# Patient Record
Sex: Female | Born: 1985 | Race: Black or African American | Hispanic: No | Marital: Married | Smoking: Never smoker
Health system: Southern US, Community
[De-identification: ages and names within clinical notes are randomized; demographics above are authoritative.]

## PROBLEM LIST (undated history)

## (undated) ENCOUNTER — Inpatient Hospital Stay (HOSPITAL_COMMUNITY): Payer: Self-pay

## (undated) ENCOUNTER — Inpatient Hospital Stay (HOSPITAL_COMMUNITY)

## (undated) DIAGNOSIS — D649 Anemia, unspecified: Secondary | ICD-10-CM

## (undated) DIAGNOSIS — O26899 Other specified pregnancy related conditions, unspecified trimester: Secondary | ICD-10-CM

## (undated) DIAGNOSIS — D573 Sickle-cell trait: Secondary | ICD-10-CM

## (undated) DIAGNOSIS — R87629 Unspecified abnormal cytological findings in specimens from vagina: Secondary | ICD-10-CM

## (undated) DIAGNOSIS — R197 Diarrhea, unspecified: Secondary | ICD-10-CM

## (undated) DIAGNOSIS — Z3480 Encounter for supervision of other normal pregnancy, unspecified trimester: Secondary | ICD-10-CM

## (undated) DIAGNOSIS — O219 Vomiting of pregnancy, unspecified: Secondary | ICD-10-CM

## (undated) HISTORY — PX: VULVA SURGERY: SHX837

## (undated) HISTORY — PX: TIBIA FRACTURE SURGERY: SHX806

## (undated) HISTORY — DX: Encounter for supervision of other normal pregnancy, unspecified trimester: Z34.80

## (undated) HISTORY — DX: Diarrhea, unspecified: R19.7

## (undated) HISTORY — DX: Anemia, unspecified: D64.9

## (undated) HISTORY — DX: Unspecified abnormal cytological findings in specimens from vagina: R87.629

## (undated) HISTORY — DX: Other specified pregnancy related conditions, unspecified trimester: O26.899

## (undated) HISTORY — PX: FIBULA FRACTURE SURGERY: SHX947

## (undated) HISTORY — PX: WISDOM TOOTH EXTRACTION: SHX21

## (undated) HISTORY — DX: Vomiting of pregnancy, unspecified: O21.9

---
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
## 2009-04-13 ENCOUNTER — Other Ambulatory Visit: Admission: RE | Admit: 2009-04-13 | Discharge: 2009-04-13 | Payer: Self-pay | Admitting: Family Medicine

## 2010-10-26 ENCOUNTER — Other Ambulatory Visit
Admission: RE | Admit: 2010-10-26 | Discharge: 2010-10-26 | Payer: Self-pay | Source: Home / Self Care | Admitting: Family Medicine

## 2011-03-24 NOTE — H&P (Signed)
[facility]  Patient:    Christy [name], Christy [name] Visit Number: [id number] MRN: [id number]          Service Type: EMS Location: [place] Attending Physician:  Osvaldo Human Dictated by:   Janine Limbo, M.D. Admit Date:  08/07/2001                           History and Physical  HISTORY OF PRESENT ILLNESS:     Christy [name] is a 25 year old female gravida 0, who presents to the emergency department at [facility] complaining of pain in her vulvar area.  On August 07, 2001, the patient fell in a "split" fashion and bumped her vulva.  She presented to the emergency room at the [facility] where she was found to have a small hematoma. She was told to put ice on the area and given ibuprofen.  She then presented to the [facility] tonight complaining of a great deal of discomfort and reporting that the area had grown significantly.  The patient had a normal onset of menses one year ago.  She denies other GYN issues or problems.  DRUG ALLERGIES:                 PENICILLIN causes hives.  PAST MEDICAL HISTORY:           The patient had a laceration on  her forehead repaired surgically.  She has popcorn removed from her ear as a child.  She has had no other medical problems.  She is up-to-date with her immunizations.  SOCIAL HISTORY:                 The patient is a Consulting civil engineer at [employer]. She denies cigarette use, alcohol use and recreational drug use.  REVIEW OF SYSTEMS:              Noncontributory.  FAMILY HISTORY:                 Noncontributory.  PHYSICAL EXAMINATION: GENERAL:                        The patient is afebrile and her vital signs are stable.  HEENT:                          Within normal limits except for an upset young woman because of pain.  CHEST:                          Clear.  HEART:                          Regular rate and rhythm.  ABDOMEN:                        Her abdomen is  nontender.  EXTREMITIES:                    Her extremities are within normal limits.  PELVIC:                         External genitalia:  There is marked swelling and tenderness in the right vulvar area.  The area is approximately 10 cm x 6 cm and tensely swollen.  Vaginal examination and examination of the uterus was deferred because of discomfort.  ASSESSMENT:                     Enlarging vulvar hematoma due to trauma.  PLAN:                           A long discussion was held with the patient and her mother about the etiology of her discomfort and possible therapy.  I have recommended that we proceed to the operating room for evacuation of the vulvar hematoma and for ligation of a bleeding vessel if we can find it.  They understand the indications for this procedure, and they accept the associated risks which include, but are not limited to, anesthetic complications, bleeding, infections, and possible damage to the surrounding organs. Dictated by:   Janine Limbo, M.D. Attending Physician:  Osvaldo Human DD:  08/08/01 TD:  08/08/01 Job: [id number]

## 2011-03-24 NOTE — Op Note (Signed)
[facility]  Patient:    Christy [name], Christy [name] Visit Number: [id number] MRN: [id number]          Service Type: GYN Location: [id number] Attending Physician:  Leonard Schwartz Dictated by:   Janine Limbo, M.D. Proc. Date: 08/08/01 Admit Date:  08/08/2001                             Operative Report  PREOPERATIVE DIAGNOSIS:       Expanding hematoma of the right labia.  POSTOPERATIVE DIAGNOSIS:      Expanding hematoma of the right labia.  PROCEDURE:                    Evacuation of hematoma.  SURGEON:                      Janine Limbo, M.D.  ANESTHESIA:                   General.  INDICATIONS:                  The patient is a 25 year old female, gravida 0, who fall on August 07, 2001, hitting her vulva.  She then had a slow expansion of the area that became very tender.  The patient and her mother understand the indications for the surgical procedure that was performed and they accepted the risks of but not limited to anesthetic complications, bleeding, infection and possible damage to surrounding structures.  FINDINGS:                     There was a 12 x 7 cm hematoma of the right labia.  There was a 200 cc blood clot within the area.  No other abnormalities were noted.  DESCRIPTION OF PROCEDURE:     The patient was taken to the operating room, where a general anesthetic was given.  The patients lower abdomen, perineum and vagina were then prepped with multiple layers of Betadine.  The patient was sterilely draped.  An incision was made on the medial aspect of the right labia majora.  A total of 200 cc of blood clot was removed.  The hematoma cavity was then cleaned with Betadine and then irrigated with normal saline. There was a small amount of bleeding from the base of the hematoma. Hemostasis was achieved using figure-of-eight sutures of 2-0 Vicryl.  The space was then closed using interrupted sutures of 2-0 Vicryl.  The  medial aspect of the labia was then closed using a running suture of 4-0 Vicryl.  A small area was left open at the bottom of the incision so that any small amount of bleeding or serous drainage could spontaneously drain.  The area was then injected with 20 cc of 0.25% Marcaine.  A #14 catheter was then placed in the bladder.  The patient was awakened from her anesthetic and taken to the recovery room in stable condition.  Sponge, needle and instrument counts were correct.  Estimated blood loss was 200 cc.  FOLLOW-UP INSTRUCTIONS:       The patient will be observed in the hospital for approximately eight hours.  She will be allowed to go home later.  She will be given doxycycline 100 mg b.i.d. for ten days.  She will be given Tylenol #3, 1-2 p.o. q.4h. p.r.n. pain.  She will also be allowed to use ibuprofen  600 mg q.6h. p.r.n. pain.  She will return to the office in two weeks for a follow-up examination.  She will call for questions or concerns. Dictated by:   Janine Limbo, M.D. Attending Physician:  Leonard Schwartz DD:  08/08/01 TD:  08/08/01 Job: [id number]

## 2015-12-08 DIAGNOSIS — Z0289 Encounter for other administrative examinations: Secondary | ICD-10-CM | POA: Insufficient documentation

## 2017-11-06 NOTE — L&D Delivery Note (Signed)
Delivery Note Progressed to complete dilation and pushed well to SVD  At 6:02 AM a viable and healthy female was delivered via VBAC, Spontaneous (Presentation:ROA ).  APGAR: 9, 9; weight  .   Placenta status:Spontaneous, though slightly delayed.  3 vessel Cord:  with the following complications: There was a velamentous insertion of vessels through membranes into edge of placenta.  Vessels did traverse through the membranes.  Cord noted to be short, about 13-14 inches.  Sent to pathology  Anesthesia:  Epidural Episiotomy: None Lacerations: None Suture Repair: none Est. Blood Loss (mL): 100  Mom to postpartum.  Baby to Couplet care / Skin to Skin.  Wynelle BourgeoisMarie Williams 07/24/2018, 6:40 AM  Please schedule this patient for Postpartum visit in: 4 weeks with the following provider: Any provider For C/S patients schedule nurse incision check in weeks 2 weeks: no Low risk pregnancy complicated by: Velementous cord insertion, SGA Delivery mode:  SVD Anticipated Birth Control:  vasectomy PP Procedures needed: none  Schedule Integrated BH visit: no

## 2018-02-25 ENCOUNTER — Ambulatory Visit (INDEPENDENT_AMBULATORY_CARE_PROVIDER_SITE_OTHER): Payer: TRICARE For Life (TFL) | Admitting: Obstetrics

## 2018-02-25 ENCOUNTER — Encounter: Payer: Self-pay | Admitting: Obstetrics

## 2018-02-25 VITALS — BP 122/78 | HR 79 | Temp 98.8°F | Ht 61.0 in | Wt 188.6 lb

## 2018-02-25 DIAGNOSIS — Z3480 Encounter for supervision of other normal pregnancy, unspecified trimester: Secondary | ICD-10-CM

## 2018-02-25 DIAGNOSIS — Z113 Encounter for screening for infections with a predominantly sexual mode of transmission: Secondary | ICD-10-CM

## 2018-02-25 DIAGNOSIS — Z124 Encounter for screening for malignant neoplasm of cervix: Secondary | ICD-10-CM

## 2018-02-25 DIAGNOSIS — Z3481 Encounter for supervision of other normal pregnancy, first trimester: Secondary | ICD-10-CM

## 2018-02-25 DIAGNOSIS — Z3689 Encounter for other specified antenatal screening: Secondary | ICD-10-CM | POA: Diagnosis not present

## 2018-02-25 DIAGNOSIS — Z3687 Encounter for antenatal screening for uncertain dates: Secondary | ICD-10-CM

## 2018-02-25 DIAGNOSIS — J301 Allergic rhinitis due to pollen: Secondary | ICD-10-CM

## 2018-02-25 HISTORY — DX: Encounter for supervision of other normal pregnancy, unspecified trimester: Z34.80

## 2018-02-25 MED ORDER — LORATADINE 10 MG PO TABS: 10.0000 mg | ORAL_TABLET | Freq: Every day | ORAL | 11 refills | Status: DC

## 2018-02-25 MED ORDER — VITAFOL ULTRA 29-0.6-0.4-200 MG PO CAPS: 1.0000 | ORAL_CAPSULE | Freq: Every day | ORAL | 4 refills | Status: DC

## 2018-02-25 NOTE — Addendum Note (Signed)
Addended by: Coral CeoHARPER, Clanton Emanuelson A on: 02/25/2018 11:34 AM   Modules accepted: Orders

## 2018-02-25 NOTE — Progress Notes (Signed)
Subjective:  Christy [name] is a 32 y.o. G3P1102 at 5226w1d being seen today for ongoing prenatal care.  She is currently monitored for the following issues for this low-risk pregnancy and has Supervision of other normal pregnancy, antepartum on their problem list.  Patient reports CONGESTION.   .  .   . Denies leaking of fluid.   The following portions of the patient's history were reviewed and updated as appropriate: allergies, current medications, past family history, past medical history, past social history, past surgical history and problem list. Problem list updated.  Objective:   Vitals:   02/25/18 1050 02/25/18 1051  BP: 122/78   Pulse: 79   Temp: 98.8 F (37.1 C)   Weight: 188 lb 9.6 oz (85.5 kg)   Height:  5\' 1"  (1.549 m)    Fetal Status: Fetal Heart Rate (bpm): 150         General:  Alert, oriented and cooperative. Patient is in no acute distress.  Skin: Skin is warm and dry. No rash noted.   Cardiovascular: Normal heart rate noted  Respiratory: Normal respiratory effort, no problems with respiration noted  Abdomen: Soft, gravid, appropriate for gestational age.       Pelvic:  Cervical exam deferred        Extremities: Normal range of motion.  Edema: None  Mental Status: Normal mood and affect. Normal behavior. Normal judgment and thought content.   Urinalysis:      Assessment and Plan:  Pregnancy: G3P1102 at 2726w1d  1. Supervision of other normal pregnancy, antepartum Rx: - Obstetric Panel, Including HIV - Culture, OB Urine - Cytology - PAP - SMN1 COPY NUMBER ANALYSIS (SMA Carrier Screen) - Cystic Fibrosis Mutation 97 - Hemoglobinopathy evaluation - Genetic Screening - Cervicovaginal ancillary only - Prenat-Fe Poly-Methfol-FA-DHA (VITAFOL ULTRA) 29-0.6-0.4-200 MG CAPS; Take 1 capsule by mouth daily before breakfast.  Dispense: 90 capsule; Refill: 4  2. Unsure of LMP (last menstrual period) as reason for ultrasound scan Rx: - US OB Comp Less 14 Wks;  Future  Preterm labor symptoms and general obstetric precautions including but not limited to vaginal bleeding, contractions, leaking of fluid and fetal movement were reviewed in detail with the patient. Please refer to After Visit Summary for other counseling recommendations.  Return in about 1 month (around 03/25/2018) for ROB.   Brock BadHarper, Charles A, MD

## 2018-02-26 LAB — CYTOLOGY - PAP: Diagnosis: NEGATIVE

## 2018-02-26 LAB — CERVICOVAGINAL ANCILLARY ONLY
Bacterial vaginitis: NEGATIVE
CANDIDA VAGINITIS: NEGATIVE
CHLAMYDIA, DNA PROBE: NEGATIVE
NEISSERIA GONORRHEA: NEGATIVE
TRICH (WINDOWPATH): NEGATIVE

## 2018-02-27 ENCOUNTER — Other Ambulatory Visit: Payer: Self-pay | Admitting: Obstetrics

## 2018-02-27 ENCOUNTER — Ambulatory Visit (HOSPITAL_COMMUNITY)
Admission: RE | Admit: 2018-02-27 | Discharge: 2018-02-27 | Disposition: A | Discharge status: Home / Self Care | Source: Ambulatory Visit | Attending: Obstetrics | Admitting: Obstetrics

## 2018-02-27 DIAGNOSIS — O34219 Maternal care for unspecified type scar from previous cesarean delivery: Secondary | ICD-10-CM

## 2018-02-27 DIAGNOSIS — Z3A16 16 weeks gestation of pregnancy: Secondary | ICD-10-CM | POA: Insufficient documentation

## 2018-02-27 DIAGNOSIS — O09212 Supervision of pregnancy with history of pre-term labor, second trimester: Secondary | ICD-10-CM

## 2018-02-27 DIAGNOSIS — Z363 Encounter for antenatal screening for malformations: Secondary | ICD-10-CM | POA: Insufficient documentation

## 2018-02-27 DIAGNOSIS — Z3492 Encounter for supervision of normal pregnancy, unspecified, second trimester: Secondary | ICD-10-CM

## 2018-02-27 DIAGNOSIS — O99212 Obesity complicating pregnancy, second trimester: Secondary | ICD-10-CM

## 2018-02-27 DIAGNOSIS — Z3687 Encounter for antenatal screening for uncertain dates: Secondary | ICD-10-CM | POA: Diagnosis present

## 2018-02-27 LAB — URINE CULTURE, OB REFLEX

## 2018-02-27 LAB — CULTURE, OB URINE

## 2018-02-28 ENCOUNTER — Telehealth: Payer: Self-pay

## 2018-02-28 LAB — OBSTETRIC PANEL, INCLUDING HIV
ANTIBODY SCREEN: NEGATIVE
BASOS: 1 %
Basophils Absolute: 0 10*3/uL (ref 0.0–0.2)
EOS (ABSOLUTE): 0.2 10*3/uL (ref 0.0–0.4)
EOS: 3 %
HEMOGLOBIN: 12.8 g/dL (ref 11.1–15.9)
HIV Screen 4th Generation wRfx: NONREACTIVE
Hematocrit: 37.7 % (ref 34.0–46.6)
Hepatitis B Surface Ag: NEGATIVE
IMMATURE GRANS (ABS): 0 10*3/uL (ref 0.0–0.1)
IMMATURE GRANULOCYTES: 0 %
LYMPHS ABS: 1.5 10*3/uL (ref 0.7–3.1)
LYMPHS: 19 %
MCH: 29.6 pg (ref 26.6–33.0)
MCHC: 34 g/dL (ref 31.5–35.7)
MCV: 87 fL (ref 79–97)
Monocytes Absolute: 0.5 10*3/uL (ref 0.1–0.9)
Monocytes: 6 %
NEUTROS PCT: 71 %
Neutrophils Absolute: 5.5 10*3/uL (ref 1.4–7.0)
Platelets: 252 10*3/uL (ref 150–379)
RBC: 4.32 x10E6/uL (ref 3.77–5.28)
RDW: 14.7 % (ref 12.3–15.4)
RH TYPE: POSITIVE
RPR: NONREACTIVE
Rubella Antibodies, IGG: 2.12 index (ref >0.99)
WBC: 7.7 10*3/uL (ref 3.4–10.8)

## 2018-02-28 LAB — HEMOGLOBINOPATHY EVALUATION
HGB C: 0 %
HGB S: 40.7 % — AB
HGB VARIANT: 0 %
Hemoglobin A2 Quantitation: 4.2 % — ABNORMAL HIGH (ref 1.8–3.2)
Hemoglobin F Quantitation: 0 % (ref 0.0–2.0)
Hgb A: 55.1 % — ABNORMAL LOW (ref 96.4–98.8)

## 2018-02-28 NOTE — Telephone Encounter (Signed)
Patient notified

## 2018-02-28 NOTE — Telephone Encounter (Signed)
-----   Message from Brock Badharles A Harper, MD sent at 02/28/2018  9:04 AM EDT ----- Hgb positive for Sickle Cell Trait

## 2018-03-04 ENCOUNTER — Inpatient Hospital Stay (HOSPITAL_COMMUNITY)
Admission: AD | Admit: 2018-03-04 | Discharge: 2018-03-05 | DRG: 833 | Disposition: A | Discharge status: Home / Self Care | Source: Ambulatory Visit | Attending: Obstetrics and Gynecology | Admitting: Obstetrics and Gynecology

## 2018-03-04 ENCOUNTER — Encounter (HOSPITAL_COMMUNITY): Payer: Self-pay

## 2018-03-04 ENCOUNTER — Other Ambulatory Visit: Payer: Self-pay

## 2018-03-04 DIAGNOSIS — M62830 Muscle spasm of back: Secondary | ICD-10-CM

## 2018-03-04 DIAGNOSIS — O2301 Infections of kidney in pregnancy, first trimester: Principal | ICD-10-CM

## 2018-03-04 DIAGNOSIS — O2302 Infections of kidney in pregnancy, second trimester: Secondary | ICD-10-CM | POA: Diagnosis not present

## 2018-03-04 DIAGNOSIS — O210 Mild hyperemesis gravidarum: Secondary | ICD-10-CM | POA: Diagnosis not present

## 2018-03-04 DIAGNOSIS — Z3A12 12 weeks gestation of pregnancy: Secondary | ICD-10-CM | POA: Diagnosis not present

## 2018-03-04 DIAGNOSIS — O34219 Maternal care for unspecified type scar from previous cesarean delivery: Secondary | ICD-10-CM | POA: Diagnosis not present

## 2018-03-04 DIAGNOSIS — O219 Vomiting of pregnancy, unspecified: Secondary | ICD-10-CM | POA: Diagnosis not present

## 2018-03-04 DIAGNOSIS — R109 Unspecified abdominal pain: Secondary | ICD-10-CM | POA: Diagnosis present

## 2018-03-04 HISTORY — DX: Sickle-cell trait: D57.3

## 2018-03-04 HISTORY — DX: Vomiting of pregnancy, unspecified: O21.9

## 2018-03-04 LAB — CBC WITH DIFFERENTIAL/PLATELET
BASOS ABS: 0 10*3/uL (ref 0.0–0.1)
BASOS PCT: 0 %
EOS ABS: 0 10*3/uL (ref 0.0–0.7)
Eosinophils Relative: 0 %
HCT: 33.2 % — ABNORMAL LOW (ref 36.0–46.0)
Hemoglobin: 11.9 g/dL — ABNORMAL LOW (ref 12.0–15.0)
Lymphocytes Relative: 8 %
Lymphs Abs: 0.6 10*3/uL — ABNORMAL LOW (ref 0.7–4.0)
MCH: 29.7 pg (ref 26.0–34.0)
MCHC: 35.8 g/dL (ref 30.0–36.0)
MCV: 82.8 fL (ref 78.0–100.0)
Monocytes Absolute: 0.5 10*3/uL (ref 0.1–1.0)
Monocytes Relative: 7 %
Neutro Abs: 5.9 10*3/uL (ref 1.7–7.7)
Neutrophils Relative %: 85 %
PLATELETS: 218 10*3/uL (ref 150–400)
RBC: 4.01 MIL/uL (ref 3.87–5.11)
RDW: 13.3 % (ref 11.5–15.5)
WBC: 7 10*3/uL (ref 4.0–10.5)

## 2018-03-04 LAB — COMPREHENSIVE METABOLIC PANEL
ALBUMIN: 3.3 g/dL — AB (ref 3.5–5.0)
ALK PHOS: 92 U/L (ref 38–126)
ALT: 27 U/L (ref 14–54)
AST: 40 U/L (ref 15–41)
Anion gap: 11 (ref 5–15)
BUN: 5 mg/dL — ABNORMAL LOW (ref 6–20)
CHLORIDE: 106 mmol/L (ref 101–111)
CO2: 17 mmol/L — AB (ref 22–32)
CREATININE: 0.51 mg/dL (ref 0.44–1.00)
Calcium: 8.4 mg/dL — ABNORMAL LOW (ref 8.9–10.3)
GFR calc non Af Amer: >60 mL/min (ref >60)
GLUCOSE: 85 mg/dL (ref 65–99)
Potassium: 4.3 mmol/L (ref 3.5–5.1)
SODIUM: 134 mmol/L — AB (ref 135–145)
Total Bilirubin: 1.1 mg/dL (ref 0.3–1.2)
Total Protein: 6.7 g/dL (ref 6.5–8.1)

## 2018-03-04 LAB — URINALYSIS, ROUTINE W REFLEX MICROSCOPIC
BILIRUBIN URINE: NEGATIVE
Glucose, UA: NEGATIVE mg/dL
Hgb urine dipstick: NEGATIVE
KETONES UR: 20 mg/dL — AB
NITRITE: NEGATIVE
PROTEIN: NEGATIVE mg/dL
SPECIFIC GRAVITY, URINE: 1.017 (ref 1.005–1.030)
pH: 5 (ref 5.0–8.0)

## 2018-03-04 LAB — TYPE AND SCREEN
ABO/RH(D): O POS
ANTIBODY SCREEN: NEGATIVE

## 2018-03-04 LAB — CYSTIC FIBROSIS MUTATION 97: Interpretation: NOT DETECTED

## 2018-03-04 LAB — ABO/RH: ABO/RH(D): O POS

## 2018-03-04 MED ORDER — LACTATED RINGERS IV BOLUS
1000.0000 mL | Freq: Once | INTRAVENOUS | Status: AC
  Administered 2018-03-04: 1000 mL via INTRAVENOUS

## 2018-03-04 MED ORDER — SODIUM CHLORIDE 0.9 % IV SOLN
8.0000 mg | Freq: Once | INTRAVENOUS | Status: AC
  Administered 2018-03-04: 8 mg via INTRAVENOUS
  Filled 2018-03-04: qty 4

## 2018-03-04 MED ORDER — ZOLPIDEM TARTRATE 5 MG PO TABS: 5.0000 mg | ORAL_TABLET | Freq: Every evening | ORAL | Status: DC | PRN

## 2018-03-04 MED ORDER — LACTATED RINGERS IV SOLN
INTRAVENOUS | Status: DC
  Administered 2018-03-04 – 2018-03-05 (×2): via INTRAVENOUS

## 2018-03-04 MED ORDER — PRENATAL MULTIVITAMIN CH: 1.0000 | ORAL_TABLET | Freq: Every day | ORAL | Status: DC

## 2018-03-04 MED ORDER — ENOXAPARIN SODIUM 40 MG/0.4ML ~~LOC~~ SOLN
40.0000 mg | SUBCUTANEOUS | Status: DC
  Administered 2018-03-04: 40 mg via SUBCUTANEOUS
  Filled 2018-03-04: qty 0.4

## 2018-03-04 MED ORDER — DOCUSATE SODIUM 100 MG PO CAPS: 100.0000 mg | ORAL_CAPSULE | Freq: Every day | ORAL | Status: DC

## 2018-03-04 MED ORDER — ONDANSETRON 4 MG PO TBDP
4.0000 mg | ORAL_TABLET | Freq: Three times a day (TID) | ORAL | Status: DC
  Administered 2018-03-04 – 2018-03-05 (×2): 4 mg via ORAL
  Filled 2018-03-04 (×3): qty 1

## 2018-03-04 MED ORDER — DOCUSATE SODIUM 100 MG PO CAPS: 100.0000 mg | ORAL_CAPSULE | Freq: Two times a day (BID) | ORAL | Status: DC | PRN

## 2018-03-04 MED ORDER — CALCIUM CARBONATE ANTACID 500 MG PO CHEW: 2.0000 | CHEWABLE_TABLET | ORAL | Status: DC | PRN

## 2018-03-04 MED ORDER — SODIUM CHLORIDE 0.9 % IV BOLUS
1000.0000 mL | Freq: Once | INTRAVENOUS | Status: AC
  Administered 2018-03-04: 1000 mL via INTRAVENOUS

## 2018-03-04 MED ORDER — SODIUM CHLORIDE 0.9 % IV SOLN
2.0000 g | INTRAVENOUS | Status: DC
  Administered 2018-03-04: 2 g via INTRAVENOUS
  Filled 2018-03-04: qty 20

## 2018-03-04 MED ORDER — CYCLOBENZAPRINE HCL 5 MG PO TABS
5.0000 mg | ORAL_TABLET | Freq: Three times a day (TID) | ORAL | Status: DC | PRN
  Administered 2018-03-05: 5 mg via ORAL
  Filled 2018-03-04 (×2): qty 1

## 2018-03-04 MED ORDER — ACETAMINOPHEN 325 MG PO TABS
650.0000 mg | ORAL_TABLET | ORAL | Status: DC | PRN
  Administered 2018-03-04 – 2018-03-05 (×2): 650 mg via ORAL
  Filled 2018-03-04 (×2): qty 2

## 2018-03-04 MED ORDER — KETOROLAC TROMETHAMINE 30 MG/ML IJ SOLN
30.0000 mg | Freq: Once | INTRAMUSCULAR | Status: AC
  Administered 2018-03-04: 30 mg via INTRAVENOUS
  Filled 2018-03-04: qty 1

## 2018-03-04 NOTE — H&P (Signed)
History   CSN: [id number]  Arrival date and time: 03/04/18 1513   First Provider Initiated Contact with Patient 03/04/18 1713         Chief Complaint  Patient presents with  . Headache  . Abdominal Pain  . Back Pain  . Emesis  . Nausea   HPI    Ms.Christy [name] is a 32 y.o. female B1D1761 @ [redacted]w[redacted]d(uncertain LMP) UKoreaconfirmed dating Patient is 184w1d here in MAU with right side abdominal pain that radiates around to her right side of her upper back. Says she has had N/V through out the pregnancy however it has been very mild. Says if she ate frequent meals she was able to eat and drink normally up until 3 days ago.  Says in the last 3 days the pain in her back and the N/V has become significantly worse. Says she is not able to keep down water, everything she drinks and eats she vomits. Says she has been laying around and feels very feverish, has chills. No URI symptoms. Thought she could sleep the symptoms off. Patient's mother is present today and says she was concerned about how her daughter was acting the last few days. Says she was not her self and seemed "sick". She went to her PCP first and was sent here for further evaluation.   History of preterm delivery @ 34 weeks due to SGA, Primary cesarean section.           OB History    Gravida  3   Para  2   Term  1   Preterm  1   AB      Living  2     SAB      TAB      Ectopic      Multiple      Live Births                  Past Medical History:  Diagnosis Date  . Anemia   . Preterm labor   . [place] cell [place]  . Vaginal Pap smear, abnormal          Past Surgical History:  Procedure Laterality Date  . CESAREAN SECTION    . VULVA SURGERY     Hematoma  . WISDOM TOOTH EXTRACTION           Family History  Problem Relation Age of Onset  . Diabetes Mother   . Diabetes Brother   . Diabetes Maternal Aunt   . Kidney disease Maternal Aunt   . Diabetes  Paternal Grandmother   . Dementia Paternal Grandmother     Social History        Tobacco Use  . Smoking status: Never Smoker  . Smokeless tobacco: Never Used  Substance Use Topics  . Alcohol use: Never    Frequency: Never  . Drug use: Never    Allergies:  Allergies  Allergen Reactions  . Penicillins Hives and Nausea And Vomiting    Has patient had a PCN reaction causing immediate rash, facial/tongue/throat swelling, SOB or lightheadedness with hypotension: Yes Has patient had a PCN reaction causing severe rash involving mucus membranes or skin necrosis: Yes Has patient had a PCN reaction that required hospitalization: No Has patient had a PCN reaction occurring within the last 10 years: Yes If all of the above answers are "NO", then may proceed with Cephalosporin use.  Medications Prior to Admission  Medication Sig Dispense Refill Last Dose  . acetaminophen (TYLENOL) 325 MG tablet Take 650 mg by mouth every 6 (six) hours as needed for mild pain or headache.   prn  . ibuprofen (ADVIL,MOTRIN) 200 MG tablet Take 200 mg by mouth every 6 (six) hours as needed for moderate pain.   03/03/2018 at Unknown time  . loratadine (CLARITIN) 10 MG tablet Take 1 tablet (10 mg total) by mouth daily. 30 tablet 11 03/03/2018 at Unknown time  . Prenat-Fe Poly-Methfol-FA-DHA (VITAFOL ULTRA) 29-0.6-0.4-200 MG CAPS Take 1 capsule by mouth daily before breakfast. 90 capsule 4    LabResultsLast48Hours        Results for orders placed or performed during the hospital encounter of 03/04/18 (from the past 48 hour(s))  Urinalysis, Routine w reflex microscopic     Status: Abnormal   Collection Time: 03/04/18  3:13 PM  Result Value Ref Range   Color, Urine YELLOW YELLOW   APPearance HAZY (A) CLEAR   Specific Gravity, Urine 1.017 1.005 - 1.030   pH 5.0 5.0 - 8.0   Glucose, UA NEGATIVE NEGATIVE mg/dL   Hgb urine dipstick NEGATIVE NEGATIVE   Bilirubin Urine  NEGATIVE NEGATIVE   Ketones, ur 20 (A) NEGATIVE mg/dL   Protein, ur NEGATIVE NEGATIVE mg/dL   Nitrite NEGATIVE NEGATIVE   Leukocytes, UA MODERATE (A) NEGATIVE   RBC / HPF 0-5 0 - 5 RBC/hpf   WBC, UA 6-10 0 - 5 WBC/hpf   Bacteria, UA FEW (A) NONE SEEN   Squamous Epithelial / LPF 0-5 0 - 5    Comment: Please note change in reference range.   Mucus PRESENT     Comment: Performed at [facility], [address], [place], [place] [address]  CBC with Differential     Status: Abnormal   Collection Time: 03/04/18  5:47 PM  Result Value Ref Range   WBC 7.0 4.0 - 10.5 K/uL   RBC 4.01 3.87 - 5.11 MIL/uL   Hemoglobin 11.9 (L) 12.0 - 15.0 g/dL   HCT 33.2 (L) 36.0 - 46.0 %   MCV 82.8 78.0 - 100.0 fL   MCH 29.7 26.0 - 34.0 pg   MCHC 35.8 30.0 - 36.0 g/dL   RDW 13.3 11.5 - 15.5 %   Platelets 218 150 - 400 K/uL   Neutrophils Relative % 85 %   Neutro Abs 5.9 1.7 - 7.7 K/uL   Lymphocytes Relative 8 %   Lymphs Abs 0.6 (L) 0.7 - 4.0 K/uL   Monocytes Relative 7 %   Monocytes Absolute 0.5 0.1 - 1.0 K/uL   Eosinophils Relative 0 %   Eosinophils Absolute 0.0 0.0 - 0.7 K/uL   Basophils Relative 0 %   Basophils Absolute 0.0 0.0 - 0.1 K/uL    Comment: Performed at [facility], [address], [place], [place] [address]  Comprehensive metabolic panel     Status: Abnormal   Collection Time: 03/04/18  5:47 PM  Result Value Ref Range   Sodium 134 (L) 135 - 145 mmol/L   Potassium 4.3 3.5 - 5.1 mmol/L   Chloride 106 101 - 111 mmol/L   CO2 17 (L) 22 - 32 mmol/L   Glucose, Bld 85 65 - 99 mg/dL   BUN 5 (L) 6 - 20 mg/dL   Creatinine, Ser 0.51 0.44 - 1.00 mg/dL   Calcium 8.4 (L) 8.9 - 10.3 mg/dL   Total Protein 6.7 6.5 - 8.1 g/dL   Albumin 3.3 (L) 3.5 -  5.0 g/dL   AST 40 15 - 41 U/L   ALT 27 14 - 54 U/L   Alkaline Phosphatase 92 38 - 126 U/L   Total Bilirubin 1.1 0.3 - 1.2 mg/dL   GFR calc non Af Amer >60 >60 mL/min   GFR calc Af Amer >60  >60 mL/min    Comment: (NOTE) The eGFR has been calculated using the CKD EPI equation. This calculation has not been validated in all clinical situations. eGFR's persistently <60 mL/min signify possible Chronic Kidney Disease.    Anion gap 11 5 - 15    Comment: Performed at [facility], [address] [place], [place] [address]      Review of Systems  Constitutional: Positive for chills. Negative for fever.  Gastrointestinal: Positive for abdominal pain.  Genitourinary: Positive for flank pain. Negative for dysuria, urgency and vaginal bleeding.  Musculoskeletal: Positive for back pain.   Physical Exam   Blood pressure 119/61, pulse (!) 113, temperature 99.2 F (37.3 C), temperature source Oral, resp. rate 16, weight 182 lb (82.6 kg), last menstrual period 12/09/2017, SpO2 98 %.  Physical Exam  Constitutional: She is oriented to person, place, and time. She appears well-developed and well-nourished.  Non-toxic appearance. She has a sickly appearance. She does not appear ill. No distress.  HENT:  Head: Normocephalic.  GI: Normal appearance. There is tenderness in the right lower quadrant, periumbilical area and suprapubic area. There is rebound and CVA tenderness (Right CVA tenderness only ). There is no rigidity and no guarding.  Genitourinary:  Genitourinary Comments: Cervix: closed, thick, posterior   Musculoskeletal: Normal range of motion.  Neurological: She is alert and oriented to person, place, and time.  Skin: Skin is warm.  Psychiatric: Her behavior is normal.   MAU Course  Procedures  None  MDM  + fetal heart tones via doppler  Urine culture pending  Lr Bolus X 1 CBC & CMP Zofran 8 mg IV & Toradol 30 mg IV Patient still with emesis bag to face saying she is extremely nauseated. Still complains of upper Rt back pain despite toradol.  Discussed patient with Dr. Ilda Basset, will admit for presumed pyelonephritis and N/V.  PCN allergy, however  confirmed with patient and mother that patient has never had an anaphylaxis reaction to PCN.   Cervical Length: 4.3 cm. On 4/26  Assessment and Plan   A:  1. Pyelonephritis affecting pregnancy in first trimester   2. Nausea and vomiting in pregnancy prior to [redacted] weeks gestation     P:  Admit to High risk OB Keflex IV NS fluid Urine culture pending  Christy Lye, NP 03/04/2018 7:36 PM  Attestation of Attending Supervision of Nurse Practitioner: Evaluation and management procedures were performed by the NP under my supervision.  I have seen and examined the patient,  reviewed the NP's note and chart, and I agree with the management and plan.   Patient states back pain started about 4 days and feels like a muscle spasm, in right upper back. No h/o similar pains in the past. 3 days of nausea and vomiting and none prior to this and no sick contacts and diarrhea s/s. Pt unable to keep anything down  U/a with +ketones no s/s of infection in the MAU and cbc negative. Pt had negative ucx at her nob visit on 4/22.  Pt states back pain is feeling better and is sipping on water which is first time she's been able to take PO for at least  a day. Pt failed po challenge in mau with zofran.   AF VS normal and stable NAD Back: left normal, right slightly ttp in right upper back Abdomen: nttp, gravid.  A/p: pt improving D/w pt that will put on scheduled zofran (pt got a dose in the MAU at 6pm), continue with IVF bolus and then transition to mivf. I told her that if tolerating improved po in the morning that can likely go home with po anti-emetics and can send home with po abx until ucx comes back. Will write for prn flexeril.   Durene Romans MD Attending Center for [employer])

## 2018-03-04 NOTE — MAU Note (Signed)
Been having chills, went to the [facility] clinic, they said she had a fever (99.2), sent her over.  Nausea, vomiting, headache. Started 2 days ago.  Pain on rt side (mid abd/back).  Has not taken any Tylenol

## 2018-03-04 NOTE — MAU Provider Note (Addendum)
History     CSN: [id number]  Arrival date and time: 03/04/18 1513   First Provider Initiated Contact with Patient 03/04/18 1713      Chief Complaint  Patient presents with  . Headache  . Abdominal Pain  . Back Pain  . Emesis  . Nausea   HPI    Christy [name] is a 32 y.o. female [id number] @ [redacted]w[redacted]d(uncertain LMP) [place] Patient is [redacted]w[redacted]d here in MAU with right side abdominal pain that radiates around to her right side of her upper back. Says she has had N/V through out the pregnancy however it has been very mild. Says if she ate frequent meals she was able to eat and drink normally up until 3 days ago.  Says in the last 3 days the pain in her back and the N/V has become significantly worse. Says she is not able to keep down water, everything she drinks and eats she vomits. Says she has been laying around and feels very feverish, has chills. No URI symptoms. Thought she could sleep the symptoms off. Patient's mother is present today and says she was concerned about how her daughter was acting the last few days. Says she was not her self and seemed "sick". She went to her PCP first and was sent here for further evaluation.   History of preterm delivery @ 34 weeks due to SGA, Primary cesarean section.   OB History    Gravida  3   Para  2   Term  1   Preterm  1   AB      Living  2     SAB      TAB      Ectopic      Multiple      Live Births              Past Medical History:  Diagnosis Date  . Anemia   . Preterm labor   . [place]  . Vaginal Pap smear, abnormal     Past Surgical History:  Procedure Laterality Date  . CESAREAN SECTION    . VULVA SURGERY     Hematoma  . WISDOM TOOTH EXTRACTION      Family History  Problem Relation Age of Onset  . Diabetes Mother   . Diabetes Brother   . Diabetes Maternal Aunt   . Kidney disease Maternal Aunt   . Diabetes Paternal Grandmother   . Dementia Paternal Grandmother      Social History   Tobacco Use  . Smoking status: Never Smoker  . Smokeless tobacco: Never Used  Substance Use Topics  . Alcohol use: Never    Frequency: Never  . Drug use: Never    Allergies:  Allergies  Allergen Reactions  . Penicillins Hives and Nausea And Vomiting    Has patient had a PCN reaction causing immediate rash, facial/tongue/throat swelling, SOB or lightheadedness with hypotension: Yes Has patient had a PCN reaction causing severe rash involving mucus membranes or skin necrosis: Yes Has patient had a PCN reaction that required hospitalization: No Has patient had a PCN reaction occurring within the last 10 years: Yes If all of the above answers are "NO", then may proceed with Cephalosporin use.     Medications Prior to Admission  Medication Sig Dispense Refill Last Dose  . acetaminophen (TYLENOL) 325 MG tablet Take 650 mg by mouth every 6 (six) hours as needed for mild pain or headache.  prn  . ibuprofen (ADVIL,MOTRIN) 200 MG tablet Take 200 mg by mouth every 6 (six) hours as needed for moderate pain.   03/03/2018 at Unknown time  . loratadine (CLARITIN) 10 MG tablet Take 1 tablet (10 mg total) by mouth daily. 30 tablet 11 03/03/2018 at Unknown time  . Prenat-Fe Poly-Methfol-FA-DHA (VITAFOL ULTRA) 29-0.6-0.4-200 MG CAPS Take 1 capsule by mouth daily before breakfast. 90 capsule 4    Results for orders placed or performed during the hospital encounter of 03/04/18 (from the past 48 hour(s))  Urinalysis, Routine w reflex microscopic     Status: Abnormal   Collection Time: 03/04/18  3:13 PM  Result Value Ref Range   Color, Urine YELLOW YELLOW   APPearance HAZY (A) CLEAR   Specific Gravity, Urine 1.017 1.005 - 1.030   pH 5.0 5.0 - 8.0   Glucose, UA NEGATIVE NEGATIVE mg/dL   Hgb urine dipstick NEGATIVE NEGATIVE   Bilirubin Urine NEGATIVE NEGATIVE   Ketones, ur 20 (A) NEGATIVE mg/dL   Protein, ur NEGATIVE NEGATIVE mg/dL   Nitrite NEGATIVE NEGATIVE   Leukocytes, UA  MODERATE (A) NEGATIVE   RBC / HPF 0-5 0 - 5 RBC/hpf   WBC, UA 6-10 0 - 5 WBC/hpf   Bacteria, UA FEW (A) NONE SEEN   Squamous Epithelial / LPF 0-5 0 - 5    Comment: Please note change in reference range.   Mucus PRESENT     Comment: Performed at [facility], [address] [place], [place] [address]  CBC with Differential     Status: Abnormal   Collection Time: 03/04/18  5:47 PM  Result Value Ref Range   WBC 7.0 4.0 - 10.5 K/uL   RBC 4.01 3.87 - 5.11 MIL/uL   Hemoglobin 11.9 (L) 12.0 - 15.0 g/dL   HCT 33.2 (L) 36.0 - 46.0 %   MCV 82.8 78.0 - 100.0 fL   MCH 29.7 26.0 - 34.0 pg   MCHC 35.8 30.0 - 36.0 g/dL   RDW 13.3 11.5 - 15.5 %   Platelets 218 150 - 400 K/uL   Neutrophils Relative % 85 %   Neutro Abs 5.9 1.7 - 7.7 K/uL   Lymphocytes Relative 8 %   Lymphs Abs 0.6 (L) 0.7 - 4.0 K/uL   Monocytes Relative 7 %   Monocytes Absolute 0.5 0.1 - 1.0 K/uL   Eosinophils Relative 0 %   Eosinophils Absolute 0.0 0.0 - 0.7 K/uL   Basophils Relative 0 %   Basophils Absolute 0.0 0.0 - 0.1 K/uL    Comment: Performed at [facility], [address] [place], [place] [address]  Comprehensive metabolic panel     Status: Abnormal   Collection Time: 03/04/18  5:47 PM  Result Value Ref Range   Sodium 134 (L) 135 - 145 mmol/L   Potassium 4.3 3.5 - 5.1 mmol/L   Chloride 106 101 - 111 mmol/L   CO2 17 (L) 22 - 32 mmol/L   Glucose, Bld 85 65 - 99 mg/dL   BUN 5 (L) 6 - 20 mg/dL   Creatinine, Ser 0.51 0.44 - 1.00 mg/dL   Calcium 8.4 (L) 8.9 - 10.3 mg/dL   Total Protein 6.7 6.5 - 8.1 g/dL   Albumin 3.3 (L) 3.5 - 5.0 g/dL   AST 40 15 - 41 U/L   ALT 27 14 - 54 U/L   Alkaline Phosphatase 92 38 - 126 U/L   Total Bilirubin 1.1 0.3 - 1.2 mg/dL   GFR calc non Af Amer >  60 >60 mL/min   GFR calc Af Amer >60 >60 mL/min    Comment: (NOTE) The eGFR has been calculated using the CKD EPI equation. This calculation has not been validated in all clinical situations. eGFR's persistently <60 mL/min  signify possible Chronic Kidney Disease.    Anion gap 11 5 - 15    Comment: Performed at [facility], [address], [place], [place] [address]    Review of Systems  Constitutional: Positive for chills. Negative for fever.  Gastrointestinal: Positive for abdominal pain.  Genitourinary: Positive for flank pain. Negative for dysuria, urgency and vaginal bleeding.  Musculoskeletal: Positive for back pain.   Physical Exam   Blood pressure 119/61, pulse (!) 113, temperature 99.2 F (37.3 C), temperature source Oral, resp. rate 16, weight 182 lb (82.6 kg), last menstrual period 12/09/2017, SpO2 98 %.  Physical Exam  Constitutional: She is oriented to person, place, and time. She appears well-developed and well-nourished.  Non-toxic appearance. She has a sickly appearance. She does not appear ill. No distress.  HENT:  Head: Normocephalic.  GI: Normal appearance. There is tenderness in the right lower quadrant, periumbilical area and suprapubic area. There is rebound and CVA tenderness (Right CVA tenderness only ). There is no rigidity and no guarding.  Genitourinary:  Genitourinary Comments: Cervix: closed, thick, posterior   Musculoskeletal: Normal range of motion.  Neurological: She is alert and oriented to person, place, and time.  Skin: Skin is warm.  Psychiatric: Her behavior is normal.   MAU Course  Procedures  None  MDM  + fetal heart tones via doppler  Urine culture pending  Lr Bolus X 1 CBC & CMP Zofran 8 mg IV & Toradol 30 mg IV Patient still with emesis bag to face saying she is extremely nauseated. Still complains of upper Rt back pain despite toradol.  Discussed patient with Dr. Ilda Basset, will admit for presumed pyelonephritis and N/V.  PCN allergy, however confirmed with patient and mother that patient has never had an anaphylaxis reaction to PCN.   Cervical Length: 4.3  cm. On 4/26  Assessment and Plan   A:  1. Pyelonephritis affecting pregnancy in  first trimester   2. Nausea and vomiting in pregnancy prior to [redacted] weeks gestation     P:  Admit to High risk OB Keflex IV NS fluid Urine culture pending  Lezlie Lye, NP 03/04/2018 7:36 PM

## 2018-03-05 LAB — CULTURE, OB URINE: SPECIAL REQUESTS: NORMAL

## 2018-03-05 MED ORDER — PROMETHAZINE HCL 25 MG PO TABS: 25.0000 mg | ORAL_TABLET | Freq: Four times a day (QID) | ORAL | 2 refills | Status: DC | PRN

## 2018-03-05 MED ORDER — NITROFURANTOIN MONOHYD MACRO 100 MG PO CAPS: 100.0000 mg | ORAL_CAPSULE | Freq: Two times a day (BID) | ORAL | 0 refills | Status: DC

## 2018-03-05 MED ORDER — CYCLOBENZAPRINE HCL 5 MG PO TABS: 5.0000 mg | ORAL_TABLET | Freq: Three times a day (TID) | ORAL | 0 refills | Status: DC | PRN

## 2018-03-05 MED ORDER — ONDANSETRON 4 MG PO TBDP: 4.0000 mg | ORAL_TABLET | Freq: Three times a day (TID) | ORAL | 0 refills | Status: AC

## 2018-03-05 NOTE — Progress Notes (Addendum)
Daily Antepartum Note  Admission Date: 03/04/2018 Current Date: 03/05/2018 7:17 AM  Christy [name] is a 32 y.o. [id number] @ [redacted]w[redacted]d, HD#2, admitted for right back pain, exacerbation of n/v of pregnancy..  Pregnancy complicated by: Patient Active Problem List   Diagnosis Date Noted  . Pyelonephritis affecting pregnancy 03/04/2018  . Nausea and vomiting of pregnancy, antepartum 03/04/2018  . Uncertain dates, antepartum, second trimester   . Previous cesarean delivery affecting pregnancy   . Previous preterm delivery, antepartum, second trimester   . Encounter for antenatal screening for malformations   . Obesity affecting pregnancy in second trimester   . Supervision of other normal pregnancy, antepartum 02/25/2018    Overnight/24hr events:  none  Subjective:  Pt able to eat some salad last night for dinner and keep on down po liquids. Still having some nausea but scheduled zofran is helping. Subjective felt hot and cold at times but no fevers, chills. No s/s of PTL  Objective:    Current Vital Signs 24h Vital Sign Ranges  T 98.1 F (36.7 C) Temp  Avg: 98.8 F (37.1 C)  Min: 98.1 F (36.7 C)  Max: 99.2 F (37.3 C)  BP (!) 106/51 BP  Min: 106/51  Max: 119/61  HR 99 Pulse  Avg: 103.3  Min: 98  Max: 113  RR 18 Resp  Avg: 17.3  Min: 16  Max: 18  SaO2 98 % Room Air SpO2  Avg: 98.7 %  Min: 98 %  Max: 100 %       24 Hour I/O Current Shift I/O  Time Ins Outs No intake/output data recorded. No intake/output data recorded.   Patient Vitals for the past 24 hrs:  BP Temp Temp src Pulse Resp SpO2 Height Weight  03/04/18 2336 (!) 106/51 98.1 F (36.7 C) Oral 99 18 98 %  (1.549 m) 182 lb (82.6 kg)  03/04/18 2033 112/67 98.9 F (37.2 C) Oral 98 18 100 % - -  03/04/18 1900 - 98.9 F (37.2 C) Oral - - - - -  03/04/18 1550 119/61 99.2 F (37.3 C) Oral (!) 113 16 98 % - 182 lb (82.6 kg)    Physical exam: General: Well nourished, well developed female in no acute distress. Abdomen:  gravid nttp Back: mildly ttp on right back but no  Cardiovascular: S1, S2 normal, no murmur, rub or gallop, regular rate and rhythm Respiratory: CTAB Extremities: no clubbing, cyanosis or edema Skin: Warm and dry.   Medications: Current Facility-Administered Medications  Medication Dose Route Frequency Provider Last Rate Last Dose  . acetaminophen (TYLENOL) tablet 650 mg  650 mg Oral Q4H PRN Rasch, Victorino Dike I, NP   650 mg at 03/05/18 0649  . calcium carbonate (TUMS - dosed in mg elemental calcium) chewable tablet 400 mg of elemental calcium  2 tablet Oral Q4H PRN Rasch, Victorino Dike I, NP      . cefTRIAXone (ROCEPHIN) 2 g in sodium chloride 0.9 % 100 mL IVPB  2 g Intravenous Q24H Rasch, Harolyn Rutherford, NP   Stopped at 03/04/18 2244  . cyclobenzaprine (FLEXERIL) tablet 5 mg  5 mg Oral TID PRN [place] Bing, MD   5 mg at 03/05/18 0715  . docusate sodium (COLACE) capsule 100 mg  100 mg Oral Daily Rasch, Victorino Dike I, NP      . enoxaparin (LOVENOX) injection 40 mg  40 mg Subcutaneous Q24H [place] Bing, MD   40 mg at 03/04/18 2254  . lactated ringers infusion   Intravenous Continuous [place] Bing,  MD 50 mL/hr at 03/04/18 2244    . ondansetron (ZOFRAN-ODT) disintegrating tablet 4 mg  4 mg Oral Q8H [place] Bing, MD   4 mg at 03/05/18 0646  . prenatal multivitamin tablet 1 tablet  1 tablet Oral Q1200 Rasch, Harolyn Rutherford, NP        Labs:  Recent Labs  Lab 03/04/18 1747  WBC 7.0  HGB 11.9*  HCT 33.2*  PLT 218    Recent Labs  Lab 03/04/18 1747  NA 134*  K 4.3  CL 106  CO2 17*  BUN 5*  CREATININE 0.51  CALCIUM 8.4*  PROT 6.7  BILITOT 1.1  ALKPHOS 92  ALT 27  AST 40  GLUCOSE 85   UCx: pending  Radiology: no new imaging   Assessment & Plan:  Pt doing well *Pregnancy: routine care. F/u FHTs for today.  *Muscle spasm vs ?right sided pyelo: still afebrile. Seems more likely more muscle spasm but pt improved and she has received a dose of rocephin. Will d/c to home macrobid  bid and f/u on ucx and prn flexeril *PPx: lovenox, oob ad lib *FEN/GI: continue scheduled zofran. regular diet, bolus remaining IVF and sliv *Dispo: okay for d/c if tolerates breakfast fine.  Will send inbasket to clinic to have her seen early next week for f/u.   Cornelia Copa MD Attending Center for [facility])

## 2018-03-05 NOTE — Progress Notes (Signed)
D/c instructions reviewed, signed, & given.  Pt able to teach back f/u appt in 1week.  Pt to be d/c in stable condition; to notify RN when d/c ride is here.

## 2018-03-05 NOTE — Discharge Summary (Signed)
Discharge Summary   Admit Date: 03/04/2018 Discharge Date: 03/05/2018 Discharging Service: Antepartum  Primary OBGYN: [facility] Admitting Physician: Durene Romans MD  Discharge Physician: Ilda Basset  Referring Provider: Maternity Admission Unit  Primary Care Provider: Lucianne Lei, MD  Admission Diagnoses: *Intrauterine pregnancy at 12/1 *Nausea, vomiting *Right sided back pain  Discharge Diagnoses: *IUP at 12/2 *Improved nausea w/o vomiting *Improved right sided back pain  Consult Orders: None   Surgeries/Procedures Performed: None  History and Physical: FXT:[id number]  Arrival date and time:4/29/191513  First Provider Initiated Contact with Patient 03/04/18 1713       Chief Complaint  Patient presents with  . Headache  . Abdominal Pain  . Back Pain  . Emesis  . Nausea   HPI   Christy Jacksonis a32 1960>_3 (uncertain LMP) [place] confirmed dating Patient is 28w1dhere in MAU with right side abdominal pain that radiates around to her right side of herupper back. Says she has had N/V through out the pregnancyhowever it has been very mild. Says if she ate frequent meals she was able to eat and drink normally up until 3 days ago. Says in the last 3 days the pain in her back and the N/V has become significantly worse. Says she is not able to keep down water, everything she drinks and eats she vomits. Says she has been laying around and feels very feverish, has chills. No URI symptoms.Thought she could sleep the symptoms off. Patient's mother is present today and says she was concerned about how her daughter was acting the last few days. Says she was not her self and seemed "sick". She went to her PCP first and was sent here for further evaluation.  History of preterm delivery @ 34 weeks due to SGA, Primary cesarean section.                   OB History   Gravida  3   Para  2    Term  1   Preterm  1   AB     Living  2     SAB     TAB     Ectopic     Multiple     Live Births                Past Medical History:  Diagnosis Date  . Anemia   . Preterm labor   . Sickle cell trait ([place]   . Vaginal Pap smear, abnormal          Past Surgical History:  Procedure Laterality Date  . CESAREAN SECTION    . VULVA SURGERY     Hematoma  . WISDOM TOOTH EXTRACTION           Family History  Problem Relation Age of Onset  . Diabetes Mother   . Diabetes Brother   . Diabetes Maternal Aunt   . Kidney disease Maternal Aunt   . Diabetes Paternal Grandmother   . Dementia Paternal Grandmother     Social History        Tobacco Use  . Smoking status: Never Smoker  . Smokeless tobacco: Never Used  Substance Use Topics  . Alcohol use: Never    Frequency: Never  . Drug use: Never    Allergies:      Allergies  Allergen Reactions  . Penicillins Hives and Nausea And Vomiting    Has patient had a PCN reaction causing immediate rash, facial/tongue/throat swelling, SOB or lightheadedness with hypotension: Yes  Has patient had a PCN reaction causing severe rash involving mucus membranes or skin necrosis: Yes Has patient had a PCN reaction that required hospitalization: No Has patient had a PCN reaction occurring within the last 10 years: Yes If all of the above answers are "NO", then may proceed with Cephalosporin use.            Medications Prior to Admission  Medication Sig Dispense Refill Last Dose  . acetaminophen (TYLENOL) 325 MG tablet Take 650 mg by mouth every 6 (six) hours as needed for mild pain or headache.   prn  . ibuprofen (ADVIL,MOTRIN) 200 MG tablet Take 200 mg by mouth every 6 (six) hours as needed for moderate pain.   03/03/2018 at Unknown time  . loratadine (CLARITIN) 10 MG tablet Take 1 tablet (10 mg total) by mouth daily. 30 tablet 11 03/03/2018 at Unknown  time  . Prenat-Fe Poly-Methfol-FA-DHA (VITAFOL ULTRA) 29-0.6-0.4-200 MG CAPS Take 1 capsule by mouth daily before breakfast. 90 capsule 4    LabResultsLast48Hours        Results for orders placed or performed during the hospital encounter of 03/04/18 (from the past 48 hour(s))  Urinalysis, Routine w reflex microscopic Status: Abnormal   Collection Time: 03/04/18 3:13 PM  Result Value Ref Range   Color, Urine YELLOW YELLOW   APPearance HAZY (A) CLEAR   Specific Gravity, Urine 1.017 1.005 - 1.030   pH 5.0 5.0 - 8.0   Glucose, UA NEGATIVE NEGATIVE mg/dL   Hgb urine dipstick NEGATIVE NEGATIVE   Bilirubin Urine NEGATIVE NEGATIVE   Ketones, ur 20 (A) NEGATIVE mg/dL   Protein, ur NEGATIVE NEGATIVE mg/dL   Nitrite NEGATIVE NEGATIVE   Leukocytes, UA MODERATE (A) NEGATIVE   RBC / HPF 0-5 0 - 5 RBC/hpf   WBC, UA 6-10 0 - 5 WBC/hpf   Bacteria, UA FEW (A) NONE SEEN   Squamous Epithelial / LPF 0-5 0 - 5    Comment: Please note change in reference range.   Mucus PRESENT     Comment: Performed at [facility], [address], [place], [place] [address]  CBC with Differential Status: Abnormal   Collection Time: 03/04/18 5:47 PM  Result Value Ref Range   WBC 7.0 4.0 - 10.5 K/uL   RBC 4.01 3.87 - 5.11 MIL/uL   Hemoglobin 11.9 (L) 12.0 - 15.0 g/dL   HCT 33.2 (L) 36.0 - 46.0 %   MCV 82.8 78.0 - 100.0 fL   MCH 29.7 26.0 - 34.0 pg   MCHC 35.8 30.0 - 36.0 g/dL   RDW 13.3 11.5 - 15.5 %   Platelets 218 150 - 400 K/uL   Neutrophils Relative % 85 %   Neutro Abs 5.9 1.7 - 7.7 K/uL   Lymphocytes Relative 8 %   Lymphs Abs 0.6 (L) 0.7 - 4.0 K/uL   Monocytes Relative 7 %   Monocytes Absolute 0.5 0.1 - 1.0 K/uL   Eosinophils Relative 0 %   Eosinophils Absolute 0.0 0.0 - 0.7 K/uL   Basophils Relative 0 %   Basophils Absolute 0.0 0.0 - 0.1 K/uL    Comment: Performed at [facility], [address] [place], [place] [address]   Comprehensive metabolic panel Status: Abnormal   Collection Time: 03/04/18 5:47 PM  Result Value Ref Range   Sodium 134 (L) 135 - 145 mmol/L   Potassium 4.3 3.5 - 5.1 mmol/L   Chloride 106 101 - 111 mmol/L   CO2 17 (L) 22 - 32 mmol/L  Glucose, Bld 85 65 - 99 mg/dL   BUN 5 (L) 6 - 20 mg/dL   Creatinine, Ser 0.51 0.44 - 1.00 mg/dL   Calcium 8.4 (L) 8.9 - 10.3 mg/dL   Total Protein 6.7 6.5 - 8.1 g/dL   Albumin 3.3 (L) 3.5 - 5.0 g/dL   AST 40 15 - 41 U/L   ALT 27 14 - 54 U/L   Alkaline Phosphatase 92 38 - 126 U/L   Total Bilirubin 1.1 0.3 - 1.2 mg/dL   GFR calc non Af Amer >60 >60 mL/min   GFR calc Af Amer >60 >60 mL/min    Comment: (NOTE) The eGFR has been calculated using the CKD EPI equation. This calculation has not been validated in all clinical situations. eGFR's persistently <60 mL/min signify possible Chronic Kidney Disease.    Anion gap 11 5 - 15    Comment: Performed at [facility], [address], [place], [place] [address]      Review of Systems  Constitutional: Positive forchills. Negative forfever.  Gastrointestinal: Positive forabdominal pain.  Genitourinary: Positive forflank pain. Negative fordysuria,urgencyand vaginal bleeding.  Musculoskeletal: Positive forback pain.  Physical Exam   Blood pressure 119/61, pulse (!) 113, temperature 99.2 F (37.3 C), temperature source Oral, resp. rate 16, weight 182 lb (82.6 kg), last menstrual period 12/09/2017, SpO2 98 %.  Physical Exam Constitutional: She isoriented to person, place, and time. She appearswell-developedand well-nourished.Non-toxic appearance. She has asickly appearance. Shedoes not appear ill. No distress.  HENT:  Head:Normocephalic.  TM:HDQQIW appearance. There istendernessin the right lower quadrant,periumbilical areaand suprapubic area. There isreboundand CVA tenderness(Right CVA tenderness only ). There isno rigidityand no guarding.   Genitourinary:  Genitourinary Comments:Cervix: closed, thick, posterior Musculoskeletal:Normal range of motion.  Neurological: She isalertand oriented to person, place, and time.  Skin: Skin iswarm.  Psychiatric: Herbehavior is normal.  MAU Course  Procedures None  MDM  + fetal heart tones via doppler Urine culture pending  Lr Bolus X 1 CBC & CMP Zofran 8 mg IV & Toradol 30 mg IV Patient still with emesis bag to face saying she is extremely nauseated. Still complains of upper Rt back pain despite toradol.  Discussed patient with Dr. Ilda Basset, will admit for presumed pyelonephritis and N/V. PCN allergy, however confirmed with patient and mother that patient has never had an anaphylaxis reaction to PCN.  CervicalLength: 4.3 cm.On 4/26  Assessment and Plan   A:  1. Pyelonephritis affecting pregnancy in first trimester   2. Nausea and vomiting in pregnancy prior to [redacted] weeks gestation    P:  Admit to High risk OB Keflex IV NS fluid Urine culture pending  Lezlie Lye, NP 03/04/2018 7:36 PM  Attestation of Attending Supervision of Nurse Practitioner: Evaluation and management procedures were performed by the NP under my supervision.  I have seen and examined the patient,  reviewed the NP's note and chart, and I agree with the management and plan.   Patient states back pain started about 4 days and feels like a muscle spasm, in right upper back. No h/o similar pains in the past. 3 days of nausea and vomiting and none prior to this and no sick contacts and diarrhea s/s. Pt unable to keep anything down  U/a with +ketones no s/s of infection in the MAU and cbc negative. Pt had negative ucx at her nob visit on 4/22.  Pt states back pain is feeling better and is sipping on water which is first time she's been able to  take PO for at least a day. Pt failed po challenge in mau with zofran.   AF VS normal and stable NAD Back: left normal,  right slightly ttp in right upper back Abdomen: nttp, gravid.  A/p: pt improving D/w pt that will put on scheduled zofran (pt got a dose in the MAU at 6pm), continue with IVF bolus and then transition to mivf. I told her that if tolerating improved po in the morning that can likely go home with po anti-emetics and can send home with po abx until ucx comes back. Will write for prn flexeril.   Durene Romans MD Attending Center for [facility] Course: *Pregnancy: no issues. Normal FHTs prior to discharge *Back pain: pt received one dose of rocephin and was sent home with macrobid while awaiting urine culture results. Low suspicion given negative ucx last week *GI: improved with scheduled PO anti-emetics and IVF hydration   Discharge Exam:   Current Vital Signs 24h Vital Sign Ranges  T 98.1 F (36.7 C) Temp  Avg: 98.8 F (37.1 C)  Min: 98.1 F (36.7 C)  Max: 99.2 F (37.3 C)  BP (!) 106/51 BP  Min: 106/51  Max: 119/61  HR 99 Pulse  Avg: 103.3  Min: 98  Max: 113  RR 18 Resp  Avg: 17.3  Min: 16  Max: 18  SaO2 98 % Room Air SpO2  Avg: 98.7 %  Min: 98 %  Max: 100 %       24 Hour I/O Current Shift I/O  Time Ins Outs No intake/output data recorded. No intake/output data recorded.   General: Well nourished, well developed female in no acute distress. Abdomen: gravid nttp Back: mildly ttp on right back but no  Cardiovascular: S1, S2 normal, no murmur, rub or gallop, regular rate and rhythm Respiratory: CTAB Extremities: no clubbing, cyanosis or edema Skin: Warm and dry.   Discharge Disposition:  Home  Patient Instructions:  Standard   Results Pending at Discharge:  UCx  Discharge Medications: Allergies as of 03/05/2018      Reactions   Penicillins Hives, Nausea And Vomiting   Has patient had a PCN reaction causing immediate rash, facial/tongue/throat swelling, SOB or lightheadedness with hypotension: Yes, no anaphylaxis reaction.   Has patient had a PCN reaction causing severe rash involving mucus membranes or skin necrosis: Yes Has patient had a PCN reaction that required hospitalization: No Has patient had a PCN reaction occurring within the last 10 years: Yes If all of the above answers are "NO", then may proceed with Cephalosporin use.      Medication List    STOP taking these medications   ibuprofen 200 MG tablet Commonly known as:  ADVIL,MOTRIN     TAKE these medications   acetaminophen 325 MG tablet Commonly known as:  TYLENOL Take 650 mg by mouth every 6 (six) hours as needed for mild pain or headache.   cyclobenzaprine 5 MG tablet Commonly known as:  FLEXERIL Take 1 tablet (5 mg total) by mouth 3 (three) times daily as needed for muscle spasms (back). Notes to patient:  Last took at 7:15am   loratadine 10 MG tablet Commonly known as:  CLARITIN Take 1 tablet (10 mg total) by mouth daily.   nitrofurantoin (macrocrystal-monohydrate) 100 MG capsule Commonly known as:  MACROBID Take 1 capsule (100 mg total) by mouth 2 (two) times daily. Take 2nd dose qhs Notes to patient:  Take for the complete 7 days even if  you start to feel better.   promethazine 25 MG tablet Commonly known as:  PHENERGAN Take 1 tablet (25 mg total) by mouth every 6 (six) hours as needed for nausea or vomiting (if not responding to zofran).   VITAFOL ULTRA 29-0.6-0.4-200 MG Caps Take 1 capsule by mouth daily before breakfast.     ASK your doctor about these medications   ondansetron 4 MG disintegrating tablet Commonly known as:  ZOFRAN-ODT Take 1 tablet (4 mg total) by mouth every 8 (eight) hours for 2 days. And after 2days do 55m po q8h prn n/v Ask about: Should I take this medication?        Future Appointments  Date Time Provider [place] 03/25/2018  1:30 PM HShelly Bombard MD [place]    CDurene RomansMD Attending [facility]

## 2018-03-06 LAB — SMN1 COPY NUMBER ANALYSIS (SMA CARRIER SCREENING)

## 2018-03-11 ENCOUNTER — Encounter: Payer: Self-pay | Admitting: Obstetrics

## 2018-03-11 ENCOUNTER — Ambulatory Visit (INDEPENDENT_AMBULATORY_CARE_PROVIDER_SITE_OTHER): Payer: TRICARE For Life (TFL) | Admitting: Obstetrics

## 2018-03-11 VITALS — BP 126/79 | HR 97 | Wt 189.0 lb

## 2018-03-11 DIAGNOSIS — O99212 Obesity complicating pregnancy, second trimester: Secondary | ICD-10-CM

## 2018-03-11 DIAGNOSIS — D573 Sickle-cell trait: Secondary | ICD-10-CM

## 2018-03-11 DIAGNOSIS — Z3482 Encounter for supervision of other normal pregnancy, second trimester: Secondary | ICD-10-CM

## 2018-03-11 DIAGNOSIS — Z3480 Encounter for supervision of other normal pregnancy, unspecified trimester: Secondary | ICD-10-CM

## 2018-03-11 DIAGNOSIS — O99210 Obesity complicating pregnancy, unspecified trimester: Secondary | ICD-10-CM

## 2018-03-11 DIAGNOSIS — J301 Allergic rhinitis due to pollen: Secondary | ICD-10-CM

## 2018-03-11 NOTE — Progress Notes (Signed)
Pt states she is much better had hospital visit for N & V on 03/05/18.

## 2018-03-11 NOTE — Progress Notes (Signed)
Subjective:  Christy [name] is a 32 y.o. [id number] at [redacted]w[redacted]d being seen today for ongoing prenatal care.  She is currently monitored for the following issues for this low-risk pregnancy and has Supervision of other normal pregnancy, antepartum; Uncertain dates, antepartum, second trimester; Previous cesarean delivery affecting pregnancy; Previous preterm delivery, antepartum, second trimester; Encounter for antenatal screening for malformations; Obesity affecting pregnancy in second trimester; Muscle spasm of back; and Nausea and vomiting of pregnancy, antepartum on their problem list.  Patient reports nausea and allergy to pollen.   . Vag. Bleeding: None.  Movement: Present. Denies leaking of fluid.   The following portions of the patient's history were reviewed and updated as appropriate: allergies, current medications, past family history, past medical history, past social history, past surgical history and problem list. Problem list updated.  Objective:   Vitals:   03/11/18 1605  BP: 126/79  Pulse: 97  Weight: 189 lb (85.7 kg)    Fetal Status:     Movement: Present     General:  Alert, oriented and cooperative. Patient is in no acute distress.  Skin: Skin is warm and dry. No rash noted.   Cardiovascular: Normal heart rate noted  Respiratory: Normal respiratory effort, no problems with respiration noted  Abdomen: Soft, gravid, appropriate for gestational age. Pain/Pressure: Absent     Pelvic:  Cervical exam deferred        Extremities: Normal range of motion.  Edema: None  Mental Status: Normal mood and affect. Normal behavior. Normal judgment and thought content.   Urinalysis:      Assessment and Plan:  Pregnancy: G3P1102 at [redacted]w[redacted]d  1. Supervision of other normal pregnancy, antepartum Rx: - [place] MFM OB COMP + 14 WK; Future  2. Sickle cell trait (HCC)  3. Obesity affecting pregnancy, antepartum  4. Seasonal allergic rhinitis due to pollen - Claritin Rx  Preterm labor symptoms  and general obstetric precautions including but not limited to vaginal bleeding, contractions, leaking of fluid and fetal movement were reviewed in detail with the patient. Please refer to After Visit Summary for other counseling recommendations.  Return in about 1 month (around 04/08/2018) for ROB.   Brock Bad, MD

## 2018-03-22 ENCOUNTER — Other Ambulatory Visit: Payer: Self-pay | Admitting: Obstetrics

## 2018-03-22 ENCOUNTER — Ambulatory Visit (HOSPITAL_COMMUNITY): Admission: RE | Admit: 2018-03-22 | Payer: TRICARE For Life (TFL) | Source: Ambulatory Visit

## 2018-03-22 DIAGNOSIS — O99212 Obesity complicating pregnancy, second trimester: Secondary | ICD-10-CM

## 2018-03-22 DIAGNOSIS — Z0489 Encounter for examination and observation for other specified reasons: Secondary | ICD-10-CM

## 2018-03-22 DIAGNOSIS — IMO0002 Reserved for concepts with insufficient information to code with codable children: Secondary | ICD-10-CM

## 2018-03-22 DIAGNOSIS — Z3480 Encounter for supervision of other normal pregnancy, unspecified trimester: Secondary | ICD-10-CM

## 2018-03-22 DIAGNOSIS — O34219 Maternal care for unspecified type scar from previous cesarean delivery: Secondary | ICD-10-CM

## 2018-03-22 DIAGNOSIS — Z3A19 19 weeks gestation of pregnancy: Secondary | ICD-10-CM

## 2018-03-25 ENCOUNTER — Encounter: Payer: Self-pay | Admitting: Obstetrics

## 2018-03-29 ENCOUNTER — Other Ambulatory Visit: Payer: Self-pay | Admitting: Obstetrics

## 2018-03-29 ENCOUNTER — Ambulatory Visit (HOSPITAL_COMMUNITY)
Admission: RE | Admit: 2018-03-29 | Discharge: 2018-03-29 | Disposition: A | Discharge status: Home / Self Care | Source: Ambulatory Visit | Attending: Obstetrics | Admitting: Obstetrics

## 2018-03-29 DIAGNOSIS — Z3A20 20 weeks gestation of pregnancy: Secondary | ICD-10-CM | POA: Diagnosis not present

## 2018-03-29 DIAGNOSIS — Z362 Encounter for other antenatal screening follow-up: Secondary | ICD-10-CM

## 2018-03-29 DIAGNOSIS — O09212 Supervision of pregnancy with history of pre-term labor, second trimester: Secondary | ICD-10-CM | POA: Insufficient documentation

## 2018-03-29 DIAGNOSIS — Z3A19 19 weeks gestation of pregnancy: Secondary | ICD-10-CM

## 2018-03-29 DIAGNOSIS — O34219 Maternal care for unspecified type scar from previous cesarean delivery: Secondary | ICD-10-CM | POA: Diagnosis not present

## 2018-03-29 DIAGNOSIS — Z0489 Encounter for examination and observation for other specified reasons: Secondary | ICD-10-CM

## 2018-03-29 DIAGNOSIS — O99212 Obesity complicating pregnancy, second trimester: Secondary | ICD-10-CM | POA: Diagnosis present

## 2018-03-29 DIAGNOSIS — IMO0002 Reserved for concepts with insufficient information to code with codable children: Secondary | ICD-10-CM

## 2018-03-29 DIAGNOSIS — O43129 Velamentous insertion of umbilical cord, unspecified trimester: Secondary | ICD-10-CM

## 2018-03-29 DIAGNOSIS — Z3480 Encounter for supervision of other normal pregnancy, unspecified trimester: Secondary | ICD-10-CM

## 2018-04-08 ENCOUNTER — Encounter: Payer: Self-pay | Admitting: Obstetrics

## 2018-04-09 ENCOUNTER — Ambulatory Visit (INDEPENDENT_AMBULATORY_CARE_PROVIDER_SITE_OTHER): Payer: TRICARE For Life (TFL) | Admitting: Obstetrics

## 2018-04-09 ENCOUNTER — Encounter: Payer: Self-pay | Admitting: Obstetrics

## 2018-04-09 VITALS — BP 120/77 | HR 92 | Wt 184.0 lb

## 2018-04-09 DIAGNOSIS — G8929 Other chronic pain: Secondary | ICD-10-CM

## 2018-04-09 DIAGNOSIS — Z3482 Encounter for supervision of other normal pregnancy, second trimester: Secondary | ICD-10-CM

## 2018-04-09 DIAGNOSIS — Z3480 Encounter for supervision of other normal pregnancy, unspecified trimester: Secondary | ICD-10-CM

## 2018-04-09 DIAGNOSIS — M545 Low back pain: Secondary | ICD-10-CM

## 2018-04-09 MED ORDER — COMFORT FIT MATERNITY SUPP SM MISC: 0 refills | Status: DC

## 2018-04-09 MED ORDER — CITRANATAL BLOOM 90-1 MG PO TABS: 1.0000 | ORAL_TABLET | Freq: Every day | ORAL | 4 refills | Status: DC

## 2018-04-09 NOTE — Progress Notes (Signed)
Subjective:  Christy [name] is a 32 y.o. G3P1102 at 956w2d being seen today for ongoing prenatal care.  She is currently monitored for the following issues for this high-risk pregnancy and has Supervision of other normal pregnancy, antepartum; Uncertain dates, antepartum, second trimester; Previous cesarean delivery affecting pregnancy; Previous preterm delivery, antepartum, second trimester; Encounter for antenatal screening for malformations; Obesity affecting pregnancy in second trimester; Muscle spasm of back; and Nausea and vomiting of pregnancy, antepartum on their problem list.  Patient reports backache and pressure.  Contractions: Irritability. Vag. Bleeding: None.  Movement: Present. Denies leaking of fluid.   The following portions of the patient's history were reviewed and updated as appropriate: allergies, current medications, past family history, past medical history, past social history, past surgical history and problem list. Problem list updated.  Objective:   Vitals:   04/09/18 1344  BP: 120/77  Pulse: 92  Weight: 184 lb (83.5 kg)    Fetal Status: Fetal Heart Rate (bpm): 150   Movement: Present     General:  Alert, oriented and cooperative. Patient is in no acute distress.  Skin: Skin is warm and dry. No rash noted.   Cardiovascular: Normal heart rate noted  Respiratory: Normal respiratory effort, no problems with respiration noted  Abdomen: Soft, gravid, appropriate for gestational age. Pain/Pressure: Present     Pelvic:  Cervical exam deferred        Extremities: Normal range of motion.  Edema: Trace  Mental Status: Normal mood and affect. Normal behavior. Normal judgment and thought content.   Urinalysis:      Assessment and Plan:  Pregnancy: G3P1102 at 5356w2d  1. Supervision of other normal pregnancy, antepartum Rx: - Prenatal-DSS-FeCb-FeGl-FA (CITRANATAL BLOOM) 90-1 MG TABS; Take 1 tablet by mouth daily before breakfast.  Dispense: 90 tablet; Refill: 4  2.  Chronic midline low back pain without sciatica Rx: - Elastic Bandages & Supports (COMFORT FIT MATERNITY SUPP SM) MISC; Wear as directed.  Dispense: 1 each; Refill: 0  Preterm labor symptoms and general obstetric precautions including but not limited to vaginal bleeding, contractions, leaking of fluid and fetal movement were reviewed in detail with the patient. Please refer to After Visit Summary for other counseling recommendations.  Return in about 1 month (around 05/07/2018) for ROB.   Brock BadHarper, Jolyssa Oplinger A, MD

## 2018-04-09 NOTE — Patient Instructions (Signed)
Back Pain in Pregnancy Back pain during pregnancy is common. Back pain may be caused by several factors that are related to changes during your pregnancy. Follow these instructions at home: Managing pain, stiffness, and swelling  If directed, apply ice for sudden (acute) back pain. ? Put ice in a plastic bag. ? Place a towel between your skin and the bag. ? Leave the ice on for 20 minutes, 2-3 times per day.  If directed, apply heat to the affected area before you exercise: ? Place a towel between your skin and the heat pack or heating pad. ? Leave the heat on for 20-30 minutes. ? Remove the heat if your skin turns bright red. This is especially important if you are unable to feel pain, heat, or cold. You may have a greater risk of getting burned. Activity  Exercise as told by your health care provider. Exercising is the best way to prevent or manage back pain.  Listen to your body when lifting. If lifting hurts, ask for help or bend your knees. This uses your leg muscles instead of your back muscles.  Squat down when picking up something from the floor. Do not bend over.  Only use bed rest as told by your health care provider. Bed rest should only be used for the most severe episodes of back pain. Standing, Sitting, and Lying Down  Do not stand in one place for long periods of time.  Use good posture when sitting. Make sure your head rests over your shoulders and is not hanging forward. Use a pillow on your lower back if necessary.  Try sleeping on your side, preferably the left side, with a pillow or two between your legs. If you are sore after a night's rest, your bed may be too soft. A firm mattress may provide more support for your back during pregnancy. General instructions  Do not wear high heels.  Eat a healthy diet. Try to gain weight within your health care provider's recommendations.  Use a maternity girdle, elastic sling, or back brace as told by your health care  provider.  Take over-the-counter and prescription medicines only as told by your health care provider.  Keep all follow-up visits as told by your health care provider. This is important. This includes any visits with any specialists, such as a physical therapist. Contact a health care provider if:  Your back pain interferes with your daily activities.  You have increasing pain in other parts of your body. Get help right away if:  You develop numbness, tingling, weakness, or problems with the use of your arms or legs.  You develop severe back pain that is not controlled with medicine.  You have a sudden change in bowel or bladder control.  You develop shortness of breath, dizziness, or you faint.  You develop nausea, vomiting, or sweating.  You have back pain that is a rhythmic, cramping pain similar to labor pains. Labor pain is usually 1-2 minutes apart, lasts for about 1 minute, and involves a bearing down feeling or pressure in your pelvis.  You have back pain and your water breaks or you have vaginal bleeding.  You have back pain or numbness that travels down your leg.  Your back pain developed after you fell.  You develop pain on one side of your back.  You see blood in your urine.  You develop skin blisters in the area of your back pain. This information is not intended to replace advice given to you   by your health care provider. Make sure you discuss any questions you have with your health care provider. Document Released: 01/31/2006 Document Revised: 03/30/2016 Document Reviewed: 07/07/2015 Elsevier Interactive Patient Education  2018 [employer] Inc[employer]  Abdominal Pain During Pregnancy Abdominal pain is common in pregnancy. Most of the time, it does not cause harm. There are many causes of abdominal pain. Some causes are more serious than others and sometimes the cause is not known. Abdominal pain can be a sign that something is very wrong with the pregnancy or the pain  may have nothing to do with the pregnancy. Always tell your health care provider if you have any abdominal pain. Follow these instructions at home:  Do not have sex or put anything in your vagina until your symptoms go away completely.  Watch your abdominal pain for any changes.  Get plenty of rest until your pain improves.  Drink enough fluid to keep your urine clear or pale yellow.  Take over-the-counter or prescription medicines only as told by your health care provider.  Keep all follow-up visits as told by your health care provider. This is important. Contact a health care provider if:  You have a fever.  Your pain gets worse or you have cramping.  Your pain continues after resting. Get help right away if:  You are bleeding, leaking fluid, or passing tissue from the vagina.  You have vomiting or diarrhea that does not go away.  You have painful or bloody urination.  You notice a decrease in your baby's movements.  You feel very weak or faint.  You have shortness of breath.  You develop a severe headache with abdominal pain.  You have abnormal vaginal discharge with abdominal pain. This information is not intended to replace advice given to you by your health care provider. Make sure you discuss any questions you have with your health care provider. Document Released: 10/23/2005 Document Revised: 08/03/2016 Document Reviewed: 05/22/2013 Elsevier Interactive Patient Education  [employer]2018 [employer].

## 2018-04-22 ENCOUNTER — Ambulatory Visit (HOSPITAL_COMMUNITY)
Admission: RE | Admit: 2018-04-22 | Discharge: 2018-04-22 | Disposition: A | Discharge status: Home / Self Care | Source: Ambulatory Visit | Attending: Obstetrics | Admitting: Obstetrics

## 2018-04-22 DIAGNOSIS — Z3A24 24 weeks gestation of pregnancy: Secondary | ICD-10-CM | POA: Diagnosis not present

## 2018-04-22 DIAGNOSIS — O09212 Supervision of pregnancy with history of pre-term labor, second trimester: Secondary | ICD-10-CM | POA: Diagnosis not present

## 2018-04-22 DIAGNOSIS — O99212 Obesity complicating pregnancy, second trimester: Secondary | ICD-10-CM | POA: Diagnosis not present

## 2018-04-22 DIAGNOSIS — O43129 Velamentous insertion of umbilical cord, unspecified trimester: Secondary | ICD-10-CM

## 2018-04-22 DIAGNOSIS — O34219 Maternal care for unspecified type scar from previous cesarean delivery: Secondary | ICD-10-CM | POA: Diagnosis not present

## 2018-04-26 ENCOUNTER — Other Ambulatory Visit: Payer: Self-pay | Admitting: Obstetrics

## 2018-05-07 ENCOUNTER — Encounter: Payer: TRICARE For Life (TFL) | Admitting: Obstetrics

## 2018-05-15 ENCOUNTER — Encounter: Payer: Self-pay | Admitting: Obstetrics

## 2018-05-15 ENCOUNTER — Ambulatory Visit (INDEPENDENT_AMBULATORY_CARE_PROVIDER_SITE_OTHER): Admitting: Obstetrics

## 2018-05-15 ENCOUNTER — Telehealth: Payer: Self-pay

## 2018-05-15 VITALS — BP 114/73 | HR 91 | Wt 177.6 lb

## 2018-05-15 DIAGNOSIS — E669 Obesity, unspecified: Secondary | ICD-10-CM

## 2018-05-15 DIAGNOSIS — O99212 Obesity complicating pregnancy, second trimester: Secondary | ICD-10-CM

## 2018-05-15 DIAGNOSIS — O0992 Supervision of high risk pregnancy, unspecified, second trimester: Secondary | ICD-10-CM

## 2018-05-15 DIAGNOSIS — O99210 Obesity complicating pregnancy, unspecified trimester: Secondary | ICD-10-CM

## 2018-05-15 DIAGNOSIS — O43129 Velamentous insertion of umbilical cord, unspecified trimester: Secondary | ICD-10-CM

## 2018-05-15 DIAGNOSIS — O0990 Supervision of high risk pregnancy, unspecified, unspecified trimester: Secondary | ICD-10-CM

## 2018-05-15 DIAGNOSIS — O43122 Velamentous insertion of umbilical cord, second trimester: Secondary | ICD-10-CM

## 2018-05-15 NOTE — Progress Notes (Signed)
Subjective:  Christy [name] is a 32 y.o. [id number] at 2137w3d being seen today for ongoing prenatal care.  She is currently monitored for the following issues for this high-risk pregnancy and has Supervision of other normal pregnancy, antepartum; Uncertain dates, antepartum, second trimester; Previous cesarean delivery affecting pregnancy; Previous preterm delivery, antepartum, second trimester; Encounter for antenatal screening for malformations; Obesity affecting pregnancy in second trimester; Muscle spasm of back; and Nausea and vomiting of pregnancy, antepartum on their problem list.  Patient reports dry cough.  Contractions: Not present. Vag. Bleeding: None.  Movement: Present. Denies leaking of fluid.   The following portions of the patient's history were reviewed and updated as appropriate: allergies, current medications, past family history, past medical history, past social history, past surgical history and problem list. Problem list updated.  Objective:   Vitals:   05/15/18 1348  BP: 114/73  Pulse: 91  Weight: 177 lb 9.6 oz (80.6 kg)    Fetal Status: Fetal Heart Rate (bpm): 150   Movement: Present     General:  Alert, oriented and cooperative. Patient is in no acute distress.  Skin: Skin is warm and dry. No rash noted.   Cardiovascular: Normal heart rate noted  Respiratory: Normal respiratory effort, no problems with respiration noted  Abdomen: Soft, gravid, appropriate for gestational age. Pain/Pressure: Absent     Pelvic:  Cervical exam deferred        Extremities: Normal range of motion.  Edema: Trace  Mental Status: Normal mood and affect. Normal behavior. Normal judgment and thought content.   Urinalysis:      Assessment and Plan:  Pregnancy: G3P1102 at 2137w3d  1. Supervision of high risk pregnancy, antepartum  2. Velamentous insertion of umbilical cord, antepartum Rx: - US MFM OB FOLLOW UP; Future  3. Obesity affecting pregnancy, antepartum   Preterm labor  symptoms and general obstetric precautions including but not limited to vaginal bleeding, contractions, leaking of fluid and fetal movement were reviewed in detail with the patient. Please refer to After Visit Summary for other counseling recommendations.  Return in about 1 week (around 05/22/2018) for ROB, 2 hour OGTT.   Brock BadHarper, Aadil Sur A, MD

## 2018-05-15 NOTE — Progress Notes (Signed)
Patient reports good fetal movement, denies pain today. 

## 2018-05-15 NOTE — Telephone Encounter (Signed)
Patient called to verify appt time.

## 2018-05-22 ENCOUNTER — Other Ambulatory Visit

## 2018-05-22 DIAGNOSIS — Z3480 Encounter for supervision of other normal pregnancy, unspecified trimester: Secondary | ICD-10-CM

## 2018-05-23 LAB — GLUCOSE TOLERANCE, 2 HOURS W/ 1HR
GLUCOSE, 2 HOUR: 108 mg/dL (ref 65–152)
GLUCOSE, FASTING: 76 mg/dL (ref 65–91)
Glucose, 1 hour: 147 mg/dL (ref 65–179)

## 2018-05-23 LAB — CBC
HEMOGLOBIN: 11.3 g/dL (ref 11.1–15.9)
Hematocrit: 34.1 % (ref 34.0–46.6)
MCH: 28.8 pg (ref 26.6–33.0)
MCHC: 33.1 g/dL (ref 31.5–35.7)
MCV: 87 fL (ref 79–97)
PLATELETS: 294 10*3/uL (ref 150–450)
RBC: 3.92 x10E6/uL (ref 3.77–5.28)
RDW: 14.1 % (ref 12.3–15.4)
WBC: 5.9 10*3/uL (ref 3.4–10.8)

## 2018-05-23 LAB — HIV ANTIBODY (ROUTINE TESTING W REFLEX): HIV SCREEN 4TH GENERATION: NONREACTIVE

## 2018-05-23 LAB — RPR: RPR: NONREACTIVE

## 2018-05-27 ENCOUNTER — Encounter: Payer: Self-pay | Admitting: Obstetrics

## 2018-05-27 ENCOUNTER — Ambulatory Visit (HOSPITAL_COMMUNITY)
Admission: RE | Admit: 2018-05-27 | Discharge: 2018-05-27 | Disposition: A | Discharge status: Home / Self Care | Source: Ambulatory Visit | Attending: Obstetrics | Admitting: Obstetrics

## 2018-05-27 ENCOUNTER — Other Ambulatory Visit: Payer: Self-pay | Admitting: Obstetrics

## 2018-05-27 ENCOUNTER — Ambulatory Visit (INDEPENDENT_AMBULATORY_CARE_PROVIDER_SITE_OTHER): Admitting: Obstetrics

## 2018-05-27 VITALS — BP 111/68 | HR 88 | Wt 178.8 lb

## 2018-05-27 DIAGNOSIS — Z3A29 29 weeks gestation of pregnancy: Secondary | ICD-10-CM

## 2018-05-27 DIAGNOSIS — O43129 Velamentous insertion of umbilical cord, unspecified trimester: Secondary | ICD-10-CM

## 2018-05-27 DIAGNOSIS — O34219 Maternal care for unspecified type scar from previous cesarean delivery: Secondary | ICD-10-CM | POA: Diagnosis not present

## 2018-05-27 DIAGNOSIS — O43123 Velamentous insertion of umbilical cord, third trimester: Secondary | ICD-10-CM | POA: Diagnosis not present

## 2018-05-27 DIAGNOSIS — O09213 Supervision of pregnancy with history of pre-term labor, third trimester: Secondary | ICD-10-CM

## 2018-05-27 DIAGNOSIS — O26899 Other specified pregnancy related conditions, unspecified trimester: Secondary | ICD-10-CM

## 2018-05-27 DIAGNOSIS — Z23 Encounter for immunization: Secondary | ICD-10-CM | POA: Diagnosis not present

## 2018-05-27 DIAGNOSIS — O99213 Obesity complicating pregnancy, third trimester: Secondary | ICD-10-CM | POA: Diagnosis not present

## 2018-05-27 DIAGNOSIS — R12 Heartburn: Secondary | ICD-10-CM

## 2018-05-27 DIAGNOSIS — O0990 Supervision of high risk pregnancy, unspecified, unspecified trimester: Secondary | ICD-10-CM

## 2018-05-27 DIAGNOSIS — O26893 Other specified pregnancy related conditions, third trimester: Secondary | ICD-10-CM

## 2018-05-27 DIAGNOSIS — O0993 Supervision of high risk pregnancy, unspecified, third trimester: Secondary | ICD-10-CM

## 2018-05-27 MED ORDER — RANITIDINE HCL 150 MG PO TABS: 150.0000 mg | ORAL_TABLET | Freq: Two times a day (BID) | ORAL | 5 refills | Status: DC

## 2018-05-27 NOTE — Progress Notes (Signed)
Patient reports good fetal movement, denies pain. 

## 2018-05-27 NOTE — Progress Notes (Signed)
Subjective:  Christy [name] is a [age] y.o. [id number] at 6849w1d being seen today for ongoing prenatal care.  She is currently monitored for the following issues for this high-risk pregnancy and has Supervision of other normal pregnancy, antepartum; Uncertain dates, antepartum, second trimester; Previous cesarean delivery affecting pregnancy; Previous preterm delivery, antepartum, second trimester; Encounter for antenatal screening for malformations; Obesity affecting pregnancy in second trimester; Muscle spasm of back; and Nausea and vomiting of pregnancy, antepartum on their problem list.  Patient reports heartburn.  Contractions: Not present. Vag. Bleeding: None.  Movement: Present. Denies leaking of fluid.   The following portions of the patient's history were reviewed and updated as appropriate: allergies, current medications, past family history, past medical history, past social history, past surgical history and problem list. Problem list updated.  Objective:   Vitals:   05/27/18 1403  BP: 111/68  Pulse: 88  Weight: 178 lb 12.8 oz (81.1 kg)    Fetal Status: Fetal Heart Rate (bpm): 150   Movement: Present     General:  Alert, oriented and cooperative. Patient is in no acute distress.  Skin: Skin is warm and dry. No rash noted.   Cardiovascular: Normal heart rate noted  Respiratory: Normal respiratory effort, no problems with respiration noted  Abdomen: Soft, gravid, appropriate for gestational age. Pain/Pressure: Absent     Pelvic:  Cervical exam deferred        Extremities: Normal range of motion.  Edema: Trace  Mental Status: Normal mood and affect. Normal behavior. Normal judgment and thought content.   Urinalysis:      Assessment and Plan:  Pregnancy: G3P1102 at 1149w1d  1. Supervision of high risk pregnancy, antepartum  2. Velamentous insertion of umbilical cord, antepartum - serial ultrasounds for growth  3. Heartburn during pregnancy, antepartum Rx: - ranitidine (ZANTAC)  150 MG tablet; Take 1 tablet (150 mg total) by mouth 2 (two) times daily.  Dispense: 60 tablet; Refill: 5  Preterm labor symptoms and general obstetric precautions including but not limited to vaginal bleeding, contractions, leaking of fluid and fetal movement were reviewed in detail with the patient. Please refer to After Visit Summary for other counseling recommendations.  Return in about 2 weeks (around 06/10/2018) for ROB.   Brock BadHarper, Rhyse Skowron A, MD

## 2018-06-10 ENCOUNTER — Other Ambulatory Visit: Payer: Self-pay

## 2018-06-10 ENCOUNTER — Encounter: Payer: Self-pay | Admitting: Obstetrics

## 2018-06-10 ENCOUNTER — Ambulatory Visit (INDEPENDENT_AMBULATORY_CARE_PROVIDER_SITE_OTHER): Admitting: Obstetrics

## 2018-06-10 VITALS — BP 114/68 | HR 81 | Wt 178.4 lb

## 2018-06-10 DIAGNOSIS — O0993 Supervision of high risk pregnancy, unspecified, third trimester: Secondary | ICD-10-CM

## 2018-06-10 DIAGNOSIS — O0990 Supervision of high risk pregnancy, unspecified, unspecified trimester: Secondary | ICD-10-CM

## 2018-06-10 NOTE — Progress Notes (Signed)
Subjective:  Christy Christy [name] is Christy [name] 32 y.o. [id number] at 4791w1d being seen today for ongoing prenatal care.  She is currently monitored for the following issues for this high-risk pregnancy and has Supervision of other normal pregnancy, antepartum; Uncertain dates, antepartum, second trimester; Previous cesarean delivery affecting pregnancy; Previous preterm delivery, antepartum, second trimester; Encounter for antenatal screening for malformations; Obesity affecting pregnancy in second trimester; Muscle spasm of back; and Nausea and vomiting of pregnancy, antepartum on their problem list.  Patient reports no complaints.  Contractions: Irritability. Vag. Bleeding: None.  Movement: Present. Denies leaking of fluid.   The following portions of the patient's history were reviewed and updated as appropriate: allergies, current medications, past family history, past medical history, past social history, past surgical history and problem list. Problem list updated.  Objective:   Vitals:   06/10/18 0949  BP: 114/68  Pulse: 81  Weight: 178 lb 6.4 oz (80.9 kg)    Fetal Status: Fetal Heart Rate (bpm): 150   Movement: Present     General:  Alert, oriented and cooperative. Patient is in no acute distress.  Skin: Skin is warm and dry. No rash noted.   Cardiovascular: Normal heart rate noted  Respiratory: Normal respiratory effort, no problems with respiration noted  Abdomen: Soft, gravid, appropriate for gestational age. Pain/Pressure: Present     Pelvic:  Cervical exam deferred        Extremities: Normal range of motion.  Edema: Trace  Mental Status: Normal mood and affect. Normal behavior. Normal judgment and thought content.   Urinalysis:      Assessment and Plan:  Pregnancy: G3P1102 at 491w1d  1. Supervision of high risk pregnancy, antepartum   Preterm labor symptoms and general obstetric precautions including but not limited to vaginal bleeding, contractions, leaking of fluid and fetal movement  were reviewed in detail with the patient. Please refer to After Visit Summary for other counseling recommendations.  Return in about 2 weeks (around 06/24/2018) for ROB.   Brock BadHarper, Christy Tolin A, MD

## 2018-06-24 ENCOUNTER — Encounter (HOSPITAL_COMMUNITY): Payer: Self-pay | Admitting: Emergency Medicine

## 2018-06-24 ENCOUNTER — Ambulatory Visit (HOSPITAL_COMMUNITY): Payer: TRICARE For Life (TFL)

## 2018-06-24 ENCOUNTER — Encounter: Payer: Self-pay | Admitting: Obstetrics

## 2018-06-24 ENCOUNTER — Ambulatory Visit (INDEPENDENT_AMBULATORY_CARE_PROVIDER_SITE_OTHER): Admitting: Obstetrics

## 2018-06-24 ENCOUNTER — Emergency Department (HOSPITAL_COMMUNITY)
Admission: EM | Admit: 2018-06-24 | Discharge: 2018-06-25 | Disposition: A | Discharge status: Home / Self Care | Attending: Emergency Medicine | Admitting: Emergency Medicine

## 2018-06-24 VITALS — BP 118/72 | HR 88 | Wt 175.2 lb

## 2018-06-24 DIAGNOSIS — Z79899 Other long term (current) drug therapy: Secondary | ICD-10-CM | POA: Insufficient documentation

## 2018-06-24 DIAGNOSIS — O2343 Unspecified infection of urinary tract in pregnancy, third trimester: Secondary | ICD-10-CM | POA: Insufficient documentation

## 2018-06-24 DIAGNOSIS — R8271 Bacteriuria: Secondary | ICD-10-CM

## 2018-06-24 DIAGNOSIS — O26893 Other specified pregnancy related conditions, third trimester: Secondary | ICD-10-CM | POA: Insufficient documentation

## 2018-06-24 DIAGNOSIS — E669 Obesity, unspecified: Secondary | ICD-10-CM

## 2018-06-24 DIAGNOSIS — R1084 Generalized abdominal pain: Secondary | ICD-10-CM | POA: Insufficient documentation

## 2018-06-24 DIAGNOSIS — Z3A35 35 weeks gestation of pregnancy: Secondary | ICD-10-CM | POA: Diagnosis not present

## 2018-06-24 DIAGNOSIS — D573 Sickle-cell trait: Secondary | ICD-10-CM

## 2018-06-24 DIAGNOSIS — O9989 Other specified diseases and conditions complicating pregnancy, childbirth and the puerperium: Secondary | ICD-10-CM | POA: Insufficient documentation

## 2018-06-24 DIAGNOSIS — M545 Low back pain: Secondary | ICD-10-CM | POA: Insufficient documentation

## 2018-06-24 DIAGNOSIS — O0993 Supervision of high risk pregnancy, unspecified, third trimester: Secondary | ICD-10-CM

## 2018-06-24 DIAGNOSIS — R109 Unspecified abdominal pain: Secondary | ICD-10-CM

## 2018-06-24 DIAGNOSIS — O0990 Supervision of high risk pregnancy, unspecified, unspecified trimester: Secondary | ICD-10-CM

## 2018-06-24 DIAGNOSIS — O99210 Obesity complicating pregnancy, unspecified trimester: Secondary | ICD-10-CM

## 2018-06-24 DIAGNOSIS — O99013 Anemia complicating pregnancy, third trimester: Secondary | ICD-10-CM

## 2018-06-24 DIAGNOSIS — O99213 Obesity complicating pregnancy, third trimester: Secondary | ICD-10-CM

## 2018-06-24 LAB — URINALYSIS, ROUTINE W REFLEX MICROSCOPIC
Bilirubin Urine: NEGATIVE
Glucose, UA: NEGATIVE mg/dL
Hgb urine dipstick: NEGATIVE
Ketones, ur: NEGATIVE mg/dL
Nitrite: NEGATIVE
PH: 7 (ref 5.0–8.0)
Protein, ur: NEGATIVE mg/dL
SPECIFIC GRAVITY, URINE: 1.004 — AB (ref 1.005–1.030)

## 2018-06-24 LAB — CBC WITH DIFFERENTIAL/PLATELET
Abs Immature Granulocytes: 0.1 10*3/uL (ref 0.0–0.1)
Basophils Absolute: 0.1 10*3/uL (ref 0.0–0.1)
Basophils Relative: 1 %
EOS ABS: 0.1 10*3/uL (ref 0.0–0.7)
EOS PCT: 1 %
HEMATOCRIT: 33.7 % — AB (ref 36.0–46.0)
HEMOGLOBIN: 11 g/dL — AB (ref 12.0–15.0)
IMMATURE GRANULOCYTES: 1 %
Lymphocytes Relative: 22 %
Lymphs Abs: 2 10*3/uL (ref 0.7–4.0)
MCH: 27.4 pg (ref 26.0–34.0)
MCHC: 32.6 g/dL (ref 30.0–36.0)
MCV: 84 fL (ref 78.0–100.0)
MONOS PCT: 10 %
Monocytes Absolute: 0.9 10*3/uL (ref 0.1–1.0)
Neutro Abs: 5.9 10*3/uL (ref 1.7–7.7)
Neutrophils Relative %: 65 %
Platelets: 271 10*3/uL (ref 150–400)
RBC: 4.01 MIL/uL (ref 3.87–5.11)
RDW: 12.9 % (ref 11.5–15.5)
WBC: 8.9 10*3/uL (ref 4.0–10.5)

## 2018-06-24 LAB — COMPREHENSIVE METABOLIC PANEL
ALK PHOS: 257 U/L — AB (ref 38–126)
ALT: 11 U/L (ref 0–44)
AST: 17 U/L (ref 15–41)
Albumin: 2.9 g/dL — ABNORMAL LOW (ref 3.5–5.0)
Anion gap: 5 (ref 5–15)
BILIRUBIN TOTAL: 0.4 mg/dL (ref 0.3–1.2)
CALCIUM: 8.6 mg/dL — AB (ref 8.9–10.3)
CO2: 23 mmol/L (ref 22–32)
CREATININE: 0.58 mg/dL (ref 0.44–1.00)
Chloride: 110 mmol/L (ref 98–111)
GFR calc Af Amer: >60 mL/min (ref >60)
Glucose, Bld: 84 mg/dL (ref 70–99)
POTASSIUM: 4.1 mmol/L (ref 3.5–5.1)
Sodium: 138 mmol/L (ref 135–145)
TOTAL PROTEIN: 6.6 g/dL (ref 6.5–8.1)

## 2018-06-24 MED ORDER — SODIUM CHLORIDE 0.9 % IV BOLUS
1000.0000 mL | Freq: Once | INTRAVENOUS | Status: AC
  Administered 2018-06-24: 1000 mL via INTRAVENOUS

## 2018-06-24 MED ORDER — NITROFURANTOIN MONOHYD MACRO 100 MG PO CAPS: 100.0000 mg | ORAL_CAPSULE | Freq: Two times a day (BID) | ORAL | 0 refills | Status: DC

## 2018-06-24 NOTE — ED Notes (Signed)
Rapid OB called  

## 2018-06-24 NOTE — Discharge Instructions (Signed)
Follow-up with your OB/GYN for reevaluation.  Return to the emergency department if symptoms worsen, notice leakage of fluid from the vagina or vaginal bleeding, worsening pain or other concerning symptoms.  May take Tylenol as needed for pain following dosing instructions on packaging. Drink plenty of fluids, at least 10-12 glasses of water daily.

## 2018-06-24 NOTE — ED Triage Notes (Addendum)
Pt states she was seen at her OB this morning. Is [redacted] weeks pregnant. Abdominal pressure, low back pain, cramping, and feeling like she needs to have a bowel movement started around noon. 3rd pregnancy. Last child born at 35 weeks d/t complications.

## 2018-06-24 NOTE — Progress Notes (Signed)
Subjective:  Christy [name] is a 32 y.o. [id number] at 1639w1d being seen today for ongoing prenatal care.  She is currently monitored for the following issues for this high-risk pregnancy and has Supervision of other normal pregnancy, antepartum; Uncertain dates, antepartum, second trimester; Previous cesarean delivery affecting pregnancy; Previous preterm delivery, antepartum, second trimester; Encounter for antenatal screening for malformations; Obesity affecting pregnancy in second trimester; Muscle spasm of back; and Nausea and vomiting of pregnancy, antepartum on their problem list.  Patient reports no complaints.  Contractions: Irregular. Vag. Bleeding: None.  Movement: Present. Denies leaking of fluid.   The following portions of the patient's history were reviewed and updated as appropriate: allergies, current medications, past family history, past medical history, past social history, past surgical history and problem list. Problem list updated.  Objective:   Vitals:   06/24/18 1039  BP: 118/72  Pulse: 88  Weight: 175 lb 3.2 oz (79.5 kg)    Fetal Status: Fetal Heart Rate (bpm): 150   Movement: Present     General:  Alert, oriented and cooperative. Patient is in no acute distress.  Skin: Skin is warm and dry. No rash noted.   Cardiovascular: Normal heart rate noted  Respiratory: Normal respiratory effort, no problems with respiration noted  Abdomen: Soft, gravid, appropriate for gestational age. Pain/Pressure: Present     Pelvic:  Cervical exam deferred        Extremities: Normal range of motion.  Edema: Trace  Mental Status: Normal mood and affect. Normal behavior. Normal judgment and thought content.   Urinalysis:      Assessment and Plan:  Pregnancy: G3P1102 at 3339w1d  1. Supervision of high risk pregnancy, antepartum  2. Obesity affecting pregnancy, antepartum   3. Sickle cell trait (HCC)   Preterm labor symptoms and general obstetric precautions including but not  limited to vaginal bleeding, contractions, leaking of fluid and fetal movement were reviewed in detail with the patient. Please refer to After Visit Summary for other counseling recommendations.  Return in about 2 weeks (around 07/08/2018).   Brock BadHarper, Destry Bezdek A, MD

## 2018-06-24 NOTE — Progress Notes (Signed)
Patient reports good fetal movement with occasional pressure and irregular contractions. 

## 2018-06-24 NOTE — Progress Notes (Signed)
Christy [name] is a G3P2, @ 33wk 1day at [facility] this evening with abdominal pain and cramping since noon. She states it is worse when she is up walking around.   FHT 150bpm, multiple accelerations, no decelerations. Only unterine irritability on contraction monitoring. SVE 1/50/high and soft.

## 2018-06-24 NOTE — ED Notes (Signed)
OB Rapid response called 

## 2018-06-24 NOTE — ED Provider Notes (Signed)
[facility]   CSN: [id number] Arrival date & time: 06/24/18  2052     History   Chief Complaint Chief Complaint  Patient presents with  . Abdominal Cramping    HPI Christy [name] is a 32 y.o. female.  HPI 32 year old G4P2 at 4935 weeks gestation presents to the emergency department today for evaluation of abdominal cramping and low back pain.  States that this afternoon she has had several episodes of what feels like a bowling ball pressing on her spine as well as abdominal cramping "like you have to have diarrhea".  Worse with standing and better with lying flat.  Has not taken any medication for this.  Denies any vaginal bleeding or leakage of fluid.  States she has never felt contractions with her prior pregnancies so doesn't know if she is having contractions at this time.  Her second child was born via cesarean section due to fetal distress. No fevers or chills. Was evaluated by her OBGYN today and told everything is looking fine. Did not have pelvic exam or cervical check today. States her prenatal ultrasounds have all been unremarkable thus far.   Past Medical History:  Diagnosis Date  . Anemia   . Preterm labor   . Sickle cell trait (HCC)   . Vaginal Pap smear, abnormal     Patient Active Problem List   Diagnosis Date Noted  . Muscle spasm of back 03/04/2018  . Nausea and vomiting of pregnancy, antepartum 03/04/2018  . Uncertain dates, antepartum, second trimester   . Previous cesarean delivery affecting pregnancy   . Previous preterm delivery, antepartum, second trimester   . Encounter for antenatal screening for malformations   . Obesity affecting pregnancy in second trimester   . Supervision of other normal pregnancy, antepartum 02/25/2018    Past Surgical History:  Procedure Laterality Date  . CESAREAN SECTION    . VULVA SURGERY     Hematoma  . WISDOM TOOTH EXTRACTION       OB History    Gravida  3   Para  2   Term  1   Preterm  1   AB      Living  2     SAB      TAB      Ectopic      Multiple      Live Births               Home Medications    Prior to Admission medications   Medication Sig Start Date End Date Taking? Authorizing Provider  acetaminophen (TYLENOL) 325 MG tablet Take 650 mg by mouth every 6 (six) hours as needed for mild pain or headache.    [provider]  cyclobenzaprine (FLEXERIL) 5 MG tablet Take 1 tablet (5 mg total) by mouth 3 (three) times daily as needed for muscle spasms (back). 03/05/18   [place] BingPickens, Charlie, MD  Elastic Bandages & Supports (COMFORT FIT MATERNITY SUPP SM) MISC Wear as directed. 04/09/18   Brock BadHarper, Charles A, MD  loratadine (CLARITIN) 10 MG tablet Take 1 tablet (10 mg total) by mouth daily. 02/25/18   Brock BadHarper, Charles A, MD  nitrofurantoin, macrocrystal-monohydrate, (MACROBID) 100 MG capsule Take 1 capsule (100 mg total) by mouth 2 (two) times daily. 06/24/18   Rigoberto Noelickens, Srija Southard, MD  Prenat-Fe Poly-Methfol-FA-DHA (VITAFOL ULTRA) 29-0.6-0.4-200 MG CAPS Take 1 capsule by mouth daily before breakfast. 02/25/18   Brock BadHarper, Charles A, MD  Prenatal-DSS-FeCb-FeGl-FA [facility](CITRANATAL BLOOM) 90-1 MG  TABS Take 1 tablet by mouth daily before breakfast. 04/09/18   Brock BadHarper, Charles A, MD  promethazine (PHENERGAN) 25 MG tablet Take 1 tablet (25 mg total) by mouth every 6 (six) hours as needed for nausea or vomiting (if not responding to zofran). 03/05/18   [place] BingPickens, Charlie, MD  ranitidine (ZANTAC) 150 MG tablet Take 1 tablet (150 mg total) by mouth 2 (two) times daily. 05/27/18   Brock BadHarper, Charles A, MD    Family History Family History  Problem Relation Age of Onset  . Diabetes Mother   . Diabetes Brother   . Diabetes Maternal Aunt   . Kidney disease Maternal Aunt   . Diabetes Paternal Grandmother   . Dementia Paternal Grandmother     Social History Social History   Tobacco Use  . Smoking status: Never Smoker  . Smokeless tobacco: Never Used    Substance Use Topics  . Alcohol use: Never    Frequency: Never  . Drug use: Never     Allergies   Penicillins   Review of Systems Review of Systems  Constitutional: Negative for chills and fever.  HENT: Negative for congestion and sore throat.   Eyes: Negative for visual disturbance.  Respiratory: Negative for cough and shortness of breath.   Cardiovascular: Negative for chest pain and leg swelling.  Gastrointestinal: Negative for abdominal pain (cramping), diarrhea, nausea and vomiting.  Genitourinary: Negative for dysuria and hematuria.  Musculoskeletal: Positive for back pain (low back). Negative for neck pain.  Skin: Negative for color change and rash.  Neurological: Negative for weakness and headaches.  All other systems reviewed and are negative.    Physical Exam Updated Vital Signs BP 106/63   Pulse 81   Resp 19   LMP 12/09/2017 (Exact Date)   SpO2 99%   Physical Exam  Constitutional: She appears well-developed and well-nourished. No distress.  HENT:  Head: Normocephalic and atraumatic.  Eyes: Conjunctivae are normal.  Neck: Neck supple.  Cardiovascular: Regular rhythm and intact distal pulses.  Pulmonary/Chest: Effort normal and breath sounds normal. No respiratory distress.  Abdominal: Soft. She exhibits no distension. There is no tenderness. There is no rebound and no guarding.  Gravid to upper abdomen  Musculoskeletal: She exhibits no edema.  Neurological: She is alert.  Skin: Skin is warm and dry.  Psychiatric: She has a normal mood and affect.  Nursing note and vitals reviewed.    ED Treatments / Results  Labs (all labs ordered are listed, but only abnormal results are displayed) Labs Reviewed  CBC WITH DIFFERENTIAL/PLATELET - Abnormal; Notable for the following components:      Result Value   Hemoglobin 11.0 (*)    HCT 33.7 (*)    All other components within normal limits  COMPREHENSIVE METABOLIC PANEL - Abnormal; Notable for the following  components:   BUN <5 (*)    Calcium 8.6 (*)    Albumin 2.9 (*)    Alkaline Phosphatase 257 (*)    All other components within normal limits  URINALYSIS, ROUTINE W REFLEX MICROSCOPIC - Abnormal; Notable for the following components:   Specific Gravity, Urine 1.004 (*)    Leukocytes, UA MODERATE (*)    Bacteria, UA MANY (*)    All other components within normal limits    EKG None  Radiology No results found.  Procedures Procedures (including critical care time)  Medications Ordered in ED Medications  sodium chloride 0.9 % bolus 1,000 mL (1,000 mLs Intravenous New Bag/Given 06/24/18 2134)  Initial Impression / Assessment and Plan / ED Course  I have reviewed the triage vital signs and the nursing notes.  Pertinent labs & imaging results that were available during my care of the patient were reviewed by me and considered in my medical decision making (see chart for details).    32 year old G4P2 at [redacted] weeks gestation presents to the emergency department today for evaluation of abdominal cramping and low back pain.   Patient is afebrile and hemodynamically stable here in the emergency department.  Fetal heart tracing and tachometry applied.  Patient appears to be having contractions approximately every 10 minutes.  No fetal heart decelerations.  OB rapid response team called and evaluating patient here in the emergency department.  They have checked patient who appears to be approximate 1 cm dilated on cervical exam.  No evidence of leakage of fluid or rupture of membranes.  Labs obtained including CBC, CMP are largely unremarkable with no acute change from prior.  Urinalysis with leukocytes and many bacteria.  Will treat for asymptomatic bacteriuria with nitrofurantoin given history of penicillin allergies.  Has tolerated Macrobid in the past. Pt exam not consistent with appendicitis or cholecystitis. Also does not appear to be obstructed. Patient received IV fluids here in the  emergency department.  Offered Tylenol for pain however patient declined.  Appears to be resting comfortably.  Does not appear to be in preterm labor at this time and OB has cleared for discharge with close outpatient follow-up with her OB. Pt comfortable with plan. Her mother is present in ED. Can ambulate in ED. Stable at time of discharge.   Case and plan of care discussed with Dr. Rubin Payor.   Final Clinical Impressions(s) / ED Diagnoses   Final diagnoses:  Bacteria in urine  Abdominal cramping  Abdominal pain during pregnancy in third trimester    ED Discharge Orders         Ordered    nitrofurantoin, macrocrystal-monohydrate, (MACROBID) 100 MG capsule  2 times daily     06/24/18 2339           Rigoberto Noel, MD 06/24/18 2349    Benjiman Core, MD 06/24/18 2352

## 2018-07-01 ENCOUNTER — Ambulatory Visit (HOSPITAL_COMMUNITY)
Admission: RE | Admit: 2018-07-01 | Discharge: 2018-07-01 | Disposition: A | Discharge status: Home / Self Care | Source: Ambulatory Visit | Attending: Obstetrics | Admitting: Obstetrics

## 2018-07-01 DIAGNOSIS — O43129 Velamentous insertion of umbilical cord, unspecified trimester: Secondary | ICD-10-CM

## 2018-07-01 DIAGNOSIS — O09213 Supervision of pregnancy with history of pre-term labor, third trimester: Secondary | ICD-10-CM

## 2018-07-01 DIAGNOSIS — O34219 Maternal care for unspecified type scar from previous cesarean delivery: Secondary | ICD-10-CM | POA: Diagnosis not present

## 2018-07-01 DIAGNOSIS — O99213 Obesity complicating pregnancy, third trimester: Secondary | ICD-10-CM

## 2018-07-01 DIAGNOSIS — O43123 Velamentous insertion of umbilical cord, third trimester: Secondary | ICD-10-CM | POA: Insufficient documentation

## 2018-07-01 DIAGNOSIS — Z3A34 34 weeks gestation of pregnancy: Secondary | ICD-10-CM | POA: Diagnosis not present

## 2018-07-09 ENCOUNTER — Encounter: Payer: Self-pay | Admitting: Obstetrics

## 2018-07-09 ENCOUNTER — Ambulatory Visit (INDEPENDENT_AMBULATORY_CARE_PROVIDER_SITE_OTHER): Admitting: Obstetrics

## 2018-07-09 VITALS — BP 130/77 | HR 96 | Wt 181.8 lb

## 2018-07-09 DIAGNOSIS — O99210 Obesity complicating pregnancy, unspecified trimester: Secondary | ICD-10-CM

## 2018-07-09 DIAGNOSIS — O0990 Supervision of high risk pregnancy, unspecified, unspecified trimester: Secondary | ICD-10-CM

## 2018-07-09 DIAGNOSIS — O99213 Obesity complicating pregnancy, third trimester: Secondary | ICD-10-CM

## 2018-07-09 DIAGNOSIS — R350 Frequency of micturition: Secondary | ICD-10-CM

## 2018-07-09 DIAGNOSIS — O0993 Supervision of high risk pregnancy, unspecified, third trimester: Secondary | ICD-10-CM

## 2018-07-09 NOTE — Progress Notes (Signed)
Pt is G3P2 [redacted]w[redacted]d here for ROB.

## 2018-07-09 NOTE — Progress Notes (Signed)
Subjective:  Christy [name] is a 32 y.o. [id number] at [redacted]w[redacted]d being seen today for ongoing prenatal care.  She is currently monitored for the following issues for this high-risk pregnancy and has Supervision of other normal pregnancy, antepartum; Uncertain dates, antepartum, second trimester; Previous cesarean delivery affecting pregnancy; Previous preterm delivery, antepartum, second trimester; Encounter for antenatal screening for malformations; Obesity affecting pregnancy in second trimester; Muscle spasm of back; and Nausea and vomiting of pregnancy, antepartum on their problem list.  Patient reports no complaints.  Contractions: Irregular. Vag. Bleeding: None.  Movement: Present. Denies leaking of fluid.   The following portions of the patient's history were reviewed and updated as appropriate: allergies, current medications, past family history, past medical history, past social history, past surgical history and problem list. Problem list updated.  Objective:   Vitals:   07/09/18 1430  BP: 130/77  Pulse: 96  Weight: 181 lb 12.8 oz (82.5 kg)    Fetal Status: Fetal Heart Rate (bpm): 150   Movement: Present     General:  Alert, oriented and cooperative. Patient is in no acute distress.  Skin: Skin is warm and dry. No rash noted.   Cardiovascular: Normal heart rate noted  Respiratory: Normal respiratory effort, no problems with respiration noted  Abdomen: Soft, gravid, appropriate for gestational age. Pain/Pressure: Present     Pelvic:  Cervical exam deferred        Extremities: Normal range of motion.  Edema: Trace  Mental Status: Normal mood and affect. Normal behavior. Normal judgment and thought content.   Urinalysis:      Assessment and Plan:  Pregnancy: G3P1102 at [redacted]w[redacted]d  1. Supervision of high risk pregnancy, antepartum  2. Obesity affecting pregnancy, antepartum   Preterm labor symptoms and general obstetric precautions including but not limited to vaginal bleeding,  contractions, leaking of fluid and fetal movement were reviewed in detail with the patient. Please refer to After Visit Summary for other counseling recommendations.  Return in about 1 week (around 07/16/2018) for ROB.   Brock Bad, MD

## 2018-07-11 LAB — URINE CULTURE

## 2018-07-16 ENCOUNTER — Inpatient Hospital Stay (HOSPITAL_COMMUNITY)
Admission: AD | Admit: 2018-07-16 | Discharge: 2018-07-16 | Disposition: A | Discharge status: Home / Self Care | Source: Ambulatory Visit | Attending: Obstetrics & Gynecology | Admitting: Obstetrics & Gynecology

## 2018-07-16 ENCOUNTER — Encounter (HOSPITAL_COMMUNITY): Payer: Self-pay

## 2018-07-16 DIAGNOSIS — D573 Sickle-cell trait: Secondary | ICD-10-CM | POA: Insufficient documentation

## 2018-07-16 DIAGNOSIS — R109 Unspecified abdominal pain: Secondary | ICD-10-CM | POA: Diagnosis not present

## 2018-07-16 DIAGNOSIS — Z3A36 36 weeks gestation of pregnancy: Secondary | ICD-10-CM | POA: Insufficient documentation

## 2018-07-16 DIAGNOSIS — R197 Diarrhea, unspecified: Secondary | ICD-10-CM

## 2018-07-16 DIAGNOSIS — Z880 Allergy status to penicillin: Secondary | ICD-10-CM | POA: Diagnosis not present

## 2018-07-16 DIAGNOSIS — O26893 Other specified pregnancy related conditions, third trimester: Secondary | ICD-10-CM | POA: Insufficient documentation

## 2018-07-16 DIAGNOSIS — Z833 Family history of diabetes mellitus: Secondary | ICD-10-CM | POA: Diagnosis not present

## 2018-07-16 DIAGNOSIS — O34219 Maternal care for unspecified type scar from previous cesarean delivery: Secondary | ICD-10-CM | POA: Insufficient documentation

## 2018-07-16 DIAGNOSIS — O99013 Anemia complicating pregnancy, third trimester: Secondary | ICD-10-CM | POA: Insufficient documentation

## 2018-07-16 DIAGNOSIS — O4703 False labor before 37 completed weeks of gestation, third trimester: Secondary | ICD-10-CM

## 2018-07-16 DIAGNOSIS — O26899 Other specified pregnancy related conditions, unspecified trimester: Secondary | ICD-10-CM

## 2018-07-16 DIAGNOSIS — Z3480 Encounter for supervision of other normal pregnancy, unspecified trimester: Secondary | ICD-10-CM

## 2018-07-16 HISTORY — DX: Other specified pregnancy related conditions, unspecified trimester: O26.899

## 2018-07-16 HISTORY — DX: Diarrhea, unspecified: R19.7

## 2018-07-16 LAB — URINALYSIS, ROUTINE W REFLEX MICROSCOPIC
Bilirubin Urine: NEGATIVE
Glucose, UA: NEGATIVE mg/dL
HGB URINE DIPSTICK: NEGATIVE
Ketones, ur: 20 mg/dL — AB
Leukocytes, UA: NEGATIVE
Nitrite: NEGATIVE
PH: 6 (ref 5.0–8.0)
Protein, ur: NEGATIVE mg/dL
SPECIFIC GRAVITY, URINE: 1.011 (ref 1.005–1.030)

## 2018-07-16 MED ORDER — LACTATED RINGERS IV BOLUS
1000.0000 mL | Freq: Once | INTRAVENOUS | Status: AC
  Administered 2018-07-16: 1000 mL via INTRAVENOUS

## 2018-07-16 MED ORDER — LOPERAMIDE HCL 2 MG PO CAPS
4.0000 mg | ORAL_CAPSULE | Freq: Once | ORAL | Status: AC
  Administered 2018-07-16: 4 mg via ORAL
  Filled 2018-07-16: qty 2

## 2018-07-16 NOTE — MAU Note (Signed)
Pt here with c/o diarrhea, back pain, contractions. Also having a HA. Reports good fetal movement. Denies bleeding or leaking.

## 2018-07-16 NOTE — MAU Provider Note (Signed)
History     CSN: [id number]  Arrival date and time: 07/16/18 5830   First Provider Initiated Contact with Patient 07/16/18 0157      Chief Complaint  Patient presents with  . Diarrhea  . Contractions   HPI  [name] is a 32 y.o. year old G75P1102 female at [redacted]w[redacted]d weeks gestation who presents to MAU reporting diarrhea and contractions since 2100. She reports having episodes of diarrhea every 10 mins until 0030; which made her decide to come to hospital. She has only had 1 episode of diarrhea since arriving to MAU. She denies N/V or fever. She states the sx's started after eating spaghetti tonight, but no one else at home had sx's. She reports good (+) FM.  Past Medical History:  Diagnosis Date  . Anemia   . Preterm labor   . Sickle cell trait (HCC)   . Vaginal Pap smear, abnormal     Past Surgical History:  Procedure Laterality Date  . CESAREAN SECTION    . VULVA SURGERY     Hematoma  . WISDOM TOOTH EXTRACTION      Family History  Problem Relation Age of Onset  . Diabetes Mother   . Diabetes Brother   . Diabetes Maternal Aunt   . Kidney disease Maternal Aunt   . Diabetes Paternal Grandmother   . Dementia Paternal Grandmother     Social History   Tobacco Use  . Smoking status: Never Smoker  . Smokeless tobacco: Never Used  Substance Use Topics  . Alcohol use: Never    Frequency: Never  . Drug use: Never    Allergies:  Allergies  Allergen Reactions  . Amoxicillin Hives  . Penicillins Hives and Nausea And Vomiting    Has patient had a PCN reaction causing immediate rash, facial/tongue/throat swelling, SOB or lightheadedness with hypotension: Yes, no anaphylaxis reaction.  Has patient had a PCN reaction causing severe rash involving mucus membranes or skin necrosis: Yes Has patient had a PCN reaction that required hospitalization: No Has patient had a PCN reaction occurring within the last 10 years: Yes If all of the above answers are "NO", then  may proceed with Cephalosporin use.     Medications Prior to Admission  Medication Sig Dispense Refill Last Dose  . loratadine (CLARITIN) 10 MG tablet Take 1 tablet (10 mg total) by mouth daily. 30 tablet 11 Past Week at Unknown time  . nitrofurantoin, macrocrystal-monohydrate, (MACROBID) 100 MG capsule Take 1 capsule (100 mg total) by mouth 2 (two) times daily. 10 capsule 0 Past Month at Unknown time  . Prenat-Fe Poly-Methfol-FA-DHA (VITAFOL ULTRA) 29-0.6-0.4-200 MG CAPS Take 1 capsule by mouth daily before breakfast. 90 capsule 4 07/15/2018 at Unknown time  . acetaminophen (TYLENOL) 325 MG tablet Take 650 mg by mouth every 6 (six) hours as needed for mild pain or headache.   Unknown at Unknown time  . cyclobenzaprine (FLEXERIL) 5 MG tablet Take 1 tablet (5 mg total) by mouth 3 (three) times daily as needed for muscle spasms (back). (Patient not taking: Reported on 07/09/2018) 20 tablet 0 Unknown at Unknown time  . Elastic Bandages & Supports (COMFORT FIT MATERNITY SUPP SM) MISC Wear as directed. 1 each 0 Taking  . Prenatal-DSS-FeCb-FeGl-FA (CITRANATAL BLOOM) 90-1 MG TABS Take 1 tablet by mouth daily before breakfast. 90 tablet 4 Taking  . promethazine (PHENERGAN) 25 MG tablet Take 1 tablet (25 mg total) by mouth every 6 (six) hours as needed for nausea or vomiting (if not  responding to zofran). 30 tablet 2 Unknown at Unknown time  . ranitidine (ZANTAC) 150 MG tablet Take 1 tablet (150 mg total) by mouth 2 (two) times daily. (Patient not taking: Reported on 07/09/2018) 60 tablet 5 Unknown at Unknown time    Review of Systems  Constitutional: Negative.   HENT: Negative.   Eyes: Negative.   Respiratory: Negative.   Cardiovascular: Negative.   Gastrointestinal: Positive for abdominal pain and diarrhea.  Endocrine: Negative.   Genitourinary: Negative.   Musculoskeletal: Negative.   Skin: Negative.   Allergic/Immunologic: Negative.   Hematological: Negative.   Psychiatric/Behavioral: Negative.     Physical Exam   Blood pressure 115/63, pulse 70, temperature 98.1 F (36.7 C), temperature source Oral, resp. rate 18, height 5\' 1"  (1.549 m), weight 83 kg, last menstrual period 12/09/2017, SpO2 100 %.  Physical Exam  Nursing note and vitals reviewed. Constitutional: She is oriented to person, place, and time. She appears well-developed and well-nourished.  HENT:  Head: Normocephalic and atraumatic.  Eyes: Pupils are equal, round, and reactive to light.  Neck: Normal range of motion.  Cardiovascular: Normal rate, regular rhythm and normal heart sounds.  Respiratory: Effort normal and breath sounds normal.  GI: Soft. Bowel sounds are normal.  Musculoskeletal: Normal range of motion.  Neurological: She is alert and oriented to person, place, and time.  Skin: Skin is warm and dry.  Psychiatric: She has a normal mood and affect. Her behavior is normal. Judgment and thought content normal.    MAU Course  Procedures  MDM CCUA LR 1000 ml @ bolus rate Imodium 4 mg po -- no episodes of diarrhea, "feels better" NST - FHR: 130 bpm / moderate variability / accels present / decels absent / TOCO: irregular UC's  Results for orders placed or performed during the hospital encounter of 07/16/18 (from the past 24 hour(s))  Urinalysis, Routine w reflex microscopic     Status: Abnormal   Collection Time: 07/16/18  1:26 AM  Result Value Ref Range   Color, Urine YELLOW YELLOW   APPearance HAZY (A) CLEAR   Specific Gravity, Urine 1.011 1.005 - 1.030   pH 6.0 5.0 - 8.0   Glucose, UA NEGATIVE NEGATIVE mg/dL   Hgb urine dipstick NEGATIVE NEGATIVE   Bilirubin Urine NEGATIVE NEGATIVE   Ketones, ur 20 (A) NEGATIVE mg/dL   Protein, ur NEGATIVE NEGATIVE mg/dL   Nitrite NEGATIVE NEGATIVE   Leukocytes, UA NEGATIVE NEGATIVE      Assessment and Plan  Abdominal pain affecting pregnancy - Can take Tylenol 1000 mg every 6 hrs prn pain  Diarrhea during pregnancy - Advised to take Imodium AD OTC -  Discharge patient - Keep scheduled appt with [facility] on 07/18/18 - Patient verbalized an understanding of the plan of care and agrees.   Christy Mora, MSN, CNM 07/16/2018, 1:57 AM

## 2018-07-16 NOTE — Discharge Instructions (Signed)
You can take Imodium AD from over-the-counter as directed.

## 2018-07-18 ENCOUNTER — Ambulatory Visit (INDEPENDENT_AMBULATORY_CARE_PROVIDER_SITE_OTHER): Admitting: Obstetrics and Gynecology

## 2018-07-18 ENCOUNTER — Encounter: Payer: Self-pay | Admitting: Obstetrics and Gynecology

## 2018-07-18 VITALS — BP 110/73 | HR 84 | Wt 187.0 lb

## 2018-07-18 DIAGNOSIS — O09212 Supervision of pregnancy with history of pre-term labor, second trimester: Secondary | ICD-10-CM

## 2018-07-18 DIAGNOSIS — Z113 Encounter for screening for infections with a predominantly sexual mode of transmission: Secondary | ICD-10-CM

## 2018-07-18 DIAGNOSIS — Z3480 Encounter for supervision of other normal pregnancy, unspecified trimester: Secondary | ICD-10-CM

## 2018-07-18 DIAGNOSIS — O34219 Maternal care for unspecified type scar from previous cesarean delivery: Secondary | ICD-10-CM

## 2018-07-18 LAB — OB RESULTS CONSOLE GC/CHLAMYDIA: Gonorrhea: NEGATIVE

## 2018-07-18 NOTE — Progress Notes (Signed)
   PRENATAL VISIT NOTE  Subjective:  Christy [name] is a 32 y.o. [id number] at 4427w4d being seen today for ongoing prenatal care.  She is currently monitored for the following issues for this high-risk pregnancy and has Supervision of other normal pregnancy, antepartum; Uncertain dates, antepartum, second trimester; Previous cesarean delivery affecting pregnancy; Previous preterm delivery, antepartum, second trimester; Encounter for antenatal screening for malformations; Obesity affecting pregnancy in second trimester; Muscle spasm of back; Nausea and vomiting of pregnancy, antepartum; Abdominal pain affecting pregnancy; and Diarrhea during pregnancy on their problem list.  Patient reports no complaints.  Contractions: Not present. Vag. Bleeding: None.  Movement: Present. Denies leaking of fluid.   The following portions of the patient's history were reviewed and updated as appropriate: allergies, current medications, past family history, past medical history, past social history, past surgical history and problem list. Problem list updated.  Objective:   Vitals:   07/18/18 1439  BP: 110/73  Pulse: 84  Weight: 187 lb (84.8 kg)    Fetal Status: Fetal Heart Rate (bpm): 140 Fundal Height: 36 cm Movement: Present  Presentation: Vertex  General:  Alert, oriented and cooperative. Patient is in no acute distress.  Skin: Skin is warm and dry. No rash noted.   Cardiovascular: Normal heart rate noted  Respiratory: Normal respiratory effort, no problems with respiration noted  Abdomen: Soft, gravid, appropriate for gestational age.  Pain/Pressure: Absent     Pelvic: Cervical exam performed Dilation: Fingertip Effacement (%): 30 Station: -3  Extremities: Normal range of motion.     Mental Status: Normal mood and affect. Normal behavior. Normal judgment and thought content.   Assessment and Plan:  Pregnancy: G3P1102 at 1727w4d  1. Supervision of other normal pregnancy, antepartum Patient is doing  well without complaints Cultures collected  2. Previous cesarean delivery affecting pregnancy Patient desires TOLAC- consent signed 07/18/18  3. Previous preterm delivery, antepartum, second trimester Due to NRFHT  Preterm labor symptoms and general obstetric precautions including but not limited to vaginal bleeding, contractions, leaking of fluid and fetal movement were reviewed in detail with the patient. Please refer to After Visit Summary for other counseling recommendations.  No follow-ups on file.  Future Appointments  Date Time Provider Department Center  08/06/2018 10:15 AM [facility] US 4 [facility]  09/05/2018 10:15 AM [facility] US 4 [facility]    Catalina AntiguaPeggy Breven Guidroz, MD

## 2018-07-19 LAB — CERVICOVAGINAL ANCILLARY ONLY
Chlamydia: NEGATIVE
Neisseria Gonorrhea: NEGATIVE

## 2018-07-20 ENCOUNTER — Encounter: Payer: Self-pay | Admitting: Obstetrics & Gynecology

## 2018-07-20 DIAGNOSIS — B951 Streptococcus, group B, as the cause of diseases classified elsewhere: Secondary | ICD-10-CM | POA: Insufficient documentation

## 2018-07-20 LAB — STREP GP B NAA: Strep Gp B NAA: POSITIVE — AB

## 2018-07-23 ENCOUNTER — Inpatient Hospital Stay (HOSPITAL_COMMUNITY)
Admission: AD | Admit: 2018-07-23 | Discharge: 2018-07-25 | DRG: 807 | Disposition: A | Discharge status: Home / Self Care | Attending: Family Medicine | Admitting: Family Medicine

## 2018-07-23 ENCOUNTER — Encounter (HOSPITAL_COMMUNITY): Payer: Self-pay

## 2018-07-23 ENCOUNTER — Other Ambulatory Visit: Payer: Self-pay

## 2018-07-23 DIAGNOSIS — O34219 Maternal care for unspecified type scar from previous cesarean delivery: Secondary | ICD-10-CM | POA: Diagnosis present

## 2018-07-23 DIAGNOSIS — O4290 Premature rupture of membranes, unspecified as to length of time between rupture and onset of labor, unspecified weeks of gestation: Secondary | ICD-10-CM | POA: Diagnosis present

## 2018-07-23 DIAGNOSIS — O4292 Full-term premature rupture of membranes, unspecified as to length of time between rupture and onset of labor: Principal | ICD-10-CM | POA: Diagnosis present

## 2018-07-23 DIAGNOSIS — Z3A37 37 weeks gestation of pregnancy: Secondary | ICD-10-CM | POA: Diagnosis not present

## 2018-07-23 DIAGNOSIS — Z880 Allergy status to penicillin: Secondary | ICD-10-CM

## 2018-07-23 DIAGNOSIS — O43123 Velamentous insertion of umbilical cord, third trimester: Secondary | ICD-10-CM | POA: Diagnosis present

## 2018-07-23 DIAGNOSIS — D573 Sickle-cell trait: Secondary | ICD-10-CM | POA: Diagnosis present

## 2018-07-23 DIAGNOSIS — B951 Streptococcus, group B, as the cause of diseases classified elsewhere: Secondary | ICD-10-CM

## 2018-07-23 DIAGNOSIS — O4202 Full-term premature rupture of membranes, onset of labor within 24 hours of rupture: Secondary | ICD-10-CM | POA: Diagnosis not present

## 2018-07-23 DIAGNOSIS — O99824 Streptococcus B carrier state complicating childbirth: Secondary | ICD-10-CM | POA: Diagnosis present

## 2018-07-23 DIAGNOSIS — O9902 Anemia complicating childbirth: Secondary | ICD-10-CM | POA: Diagnosis present

## 2018-07-23 DIAGNOSIS — Z3480 Encounter for supervision of other normal pregnancy, unspecified trimester: Secondary | ICD-10-CM

## 2018-07-23 DIAGNOSIS — O365930 Maternal care for other known or suspected poor fetal growth, third trimester, not applicable or unspecified: Secondary | ICD-10-CM | POA: Diagnosis present

## 2018-07-23 DIAGNOSIS — R197 Diarrhea, unspecified: Secondary | ICD-10-CM

## 2018-07-23 DIAGNOSIS — R109 Unspecified abdominal pain: Secondary | ICD-10-CM

## 2018-07-23 DIAGNOSIS — O26899 Other specified pregnancy related conditions, unspecified trimester: Secondary | ICD-10-CM

## 2018-07-23 LAB — CBC
HEMATOCRIT: 33.1 % — AB (ref 36.0–46.0)
Hemoglobin: 11.4 g/dL — ABNORMAL LOW (ref 12.0–15.0)
MCH: 27.3 pg (ref 26.0–34.0)
MCHC: 34.4 g/dL (ref 30.0–36.0)
MCV: 79.2 fL (ref 78.0–100.0)
PLATELETS: 290 10*3/uL (ref 150–400)
RBC: 4.18 MIL/uL (ref 3.87–5.11)
RDW: 14 % (ref 11.5–15.5)
WBC: 8.7 10*3/uL (ref 4.0–10.5)

## 2018-07-23 LAB — TYPE AND SCREEN
ABO/RH(D): O POS
ANTIBODY SCREEN: NEGATIVE

## 2018-07-23 LAB — POCT FERN TEST: POCT Fern Test: POSITIVE

## 2018-07-23 MED ORDER — LACTATED RINGERS IV SOLN
500.0000 mL | INTRAVENOUS | Status: DC | PRN
  Administered 2018-07-24: 500 mL via INTRAVENOUS

## 2018-07-23 MED ORDER — LIDOCAINE HCL (PF) 1 % IJ SOLN
30.0000 mL | INTRAMUSCULAR | Status: DC | PRN
  Filled 2018-07-23: qty 30

## 2018-07-23 MED ORDER — VANCOMYCIN HCL IN DEXTROSE 1-5 GM/200ML-% IV SOLN
1000.0000 mg | Freq: Two times a day (BID) | INTRAVENOUS | Status: DC
  Administered 2018-07-23: 1000 mg via INTRAVENOUS
  Filled 2018-07-23: qty 200

## 2018-07-23 MED ORDER — OXYCODONE-ACETAMINOPHEN 5-325 MG PO TABS: 2.0000 | ORAL_TABLET | ORAL | Status: DC | PRN

## 2018-07-23 MED ORDER — CEFAZOLIN SODIUM-DEXTROSE 2-4 GM/100ML-% IV SOLN: 2.0000 g | Freq: Three times a day (TID) | INTRAVENOUS | Status: DC

## 2018-07-23 MED ORDER — OXYCODONE-ACETAMINOPHEN 5-325 MG PO TABS: 1.0000 | ORAL_TABLET | ORAL | Status: DC | PRN

## 2018-07-23 MED ORDER — CEFAZOLIN SODIUM-DEXTROSE 1-4 GM/50ML-% IV SOLN
1.0000 g | Freq: Three times a day (TID) | INTRAVENOUS | Status: DC
  Administered 2018-07-24: 1 g via INTRAVENOUS
  Filled 2018-07-23: qty 50

## 2018-07-23 MED ORDER — OXYTOCIN 40 UNITS IN LACTATED RINGERS INFUSION - SIMPLE MED
2.5000 [IU]/h | INTRAVENOUS | Status: DC
  Filled 2018-07-23: qty 1000

## 2018-07-23 MED ORDER — SOD CITRATE-CITRIC ACID 500-334 MG/5ML PO SOLN: 30.0000 mL | ORAL | Status: DC | PRN

## 2018-07-23 MED ORDER — LACTATED RINGERS IV SOLN
INTRAVENOUS | Status: DC
  Administered 2018-07-23 – 2018-07-24 (×2): via INTRAVENOUS

## 2018-07-23 MED ORDER — FENTANYL CITRATE (PF) 100 MCG/2ML IJ SOLN
100.0000 ug | INTRAMUSCULAR | Status: DC | PRN
  Administered 2018-07-24 (×3): 100 ug via INTRAVENOUS
  Filled 2018-07-23 (×3): qty 2

## 2018-07-23 MED ORDER — OXYTOCIN BOLUS FROM INFUSION
500.0000 mL | Freq: Once | INTRAVENOUS | Status: AC
  Administered 2018-07-24: 500 mL via INTRAVENOUS

## 2018-07-23 MED ORDER — ACETAMINOPHEN 325 MG PO TABS: 650.0000 mg | ORAL_TABLET | ORAL | Status: DC | PRN

## 2018-07-23 MED ORDER — ONDANSETRON HCL 4 MG/2ML IJ SOLN: 4.0000 mg | Freq: Four times a day (QID) | INTRAMUSCULAR | Status: DC | PRN

## 2018-07-23 NOTE — MAU Note (Signed)
Felt something come out of vagina, small puddle in her pants, small trickle down her legs.  Changed clothes, diaper on, clear fluid still coming. No bleeding. No pain.  Has been 1 cm

## 2018-07-23 NOTE — Progress Notes (Signed)
Patient ID: Christy HedgesStefani [name], female   DOB: 1986-06-02, 32 y.o.   MRN: [id number] Doing well.  Still waiting for FOB to come  Vitals:   07/23/18 1711 07/23/18 1903 07/23/18 1922  BP: 125/65 121/76 118/68  Pulse: 75 68 69  Resp: 17 18 16   Temp: 98.1 F (36.7 C) 98.2 F (36.8 C) 98.3 F (36.8 C)  TempSrc: Oral Oral Oral  SpO2: 100%    Weight: 82.4 kg     Now 6 hours post PROM Discussed option of Expectant management vs  Augmentation of labor with Foley and Pitocin Offered Foley and/or Pitocin. Declines any intervention at this time, "until morming"  FHR Reactive Uterine irritability   Will do intermittent monitoring or offer Ambien for sleep with monitoring Discussed with Dr Adrian BlackwaterStinson

## 2018-07-23 NOTE — Anesthesia Pain Management Evaluation Note (Signed)
  CRNA Pain Management Visit Note  Patient: Christy HedgesStefani [name], 32 y.o., female  "Hello I am a member of the anesthesia team at [facility]. We have an anesthesia team available at all times to provide care throughout the hospital, including epidural management and anesthesia for C-section. I don't know your plan for the delivery whether it a natural birth, water birth, IV sedation, nitrous supplementation, doula or epidural, but we want to meet your pain goals."   1.Was your pain managed to your expectations on prior hospitalizations?   Yes   2.What is your expectation for pain management during this hospitalization?     Epidural and Nitrous Oxide  3.How can we help you reach that goal? Possible epidural  Record the patient's initial score and the patient's pain goal.   Pain: 0  Pain Goal: 6 [facility] wants you to be able to say your pain was always managed very well.  Cicilia Clinger 07/23/2018

## 2018-07-23 NOTE — H&P (Signed)
LABOR AND DELIVERY ADMISSION HISTORY AND PHYSICAL NOTE  Christy [name] is a 32 y.o. female [phone] with IUP at [redacted]w[redacted]d by 16 wk sono presenting for PROM. Reports ROM with clear fluids at 1500. Not currently having any ctx. Was having irregular ctx for 4 hours prior to ROM. Positive fern test in MAU.  She reports positive fetal movement. She denies vaginal bleeding.  Prenatal History/Complications: PNC at [facility] established at [redacted] wk GA  Pregnancy complications:  - Sickle Cell trait  -H/o PTD with C-section at 34wk NRFHT  -pyelonephritis in pregnancy  -velamentous cord insertion, not present on 7/22 sono   Past Medical History: Past Medical History:  Diagnosis Date  . Anemia   . Preterm labor   . Sickle cell trait (HCC)   . Vaginal Pap smear, abnormal     Past Surgical History: Past Surgical History:  Procedure Laterality Date  . CESAREAN SECTION    . FIBULA FRACTURE SURGERY    . TIBIA FRACTURE SURGERY    . VULVA SURGERY     Hematoma  . WISDOM TOOTH EXTRACTION      Obstetrical History: OB History    Gravida  3   Para  2   Term  1   Preterm  1   AB      Living  2     SAB      TAB      Ectopic      Multiple      Live Births              Social History: Social History   Socioeconomic History  . Marital status: Married    Spouse name: Not on file  . Number of children: Not on file  . Years of education: Not on file  . Highest education level: Not on file  Occupational History  . Not on file  Social Needs  . Financial resource strain: Not on file  . Food insecurity:    Worry: Not on file    Inability: Not on file  . Transportation needs:    Medical: Not on file    Non-medical: Not on file  Tobacco Use  . Smoking status: Never Smoker  . Smokeless tobacco: Never Used  Substance and Sexual Activity  . Alcohol use: Never    Frequency: Never  . Drug use: Never  . Sexual activity: Not Currently    Birth control/protection: None  Lifestyle   . Physical activity:    Days per week: Not on file    Minutes per session: Not on file  . Stress: Not on file  Relationships  . Social connections:    Talks on phone: Not on file    Gets together: Not on file    Attends religious service: Not on file    Active member of club or organization: Not on file    Attends meetings of clubs or organizations: Not on file    Relationship status: Not on file  Other Topics Concern  . Not on file  Social History Narrative  . Not on file    Family History: Family History  Problem Relation Age of Onset  . Diabetes Mother   . Diabetes Brother   . Diabetes Maternal Aunt   . Kidney disease Maternal Aunt   . Diabetes Paternal Grandmother   . Dementia Paternal Grandmother     Allergies: Allergies  Allergen Reactions  . Amoxicillin Hives  . Penicillins Hives and Nausea And Vomiting  Has patient had a PCN reaction causing immediate rash, facial/tongue/throat swelling, SOB or lightheadedness with hypotension: Yes, no anaphylaxis reaction.  Has patient had a PCN reaction causing severe rash involving mucus membranes or skin necrosis: Yes Has patient had a PCN reaction that required hospitalization: No Has patient had a PCN reaction occurring within the last 10 years: Yes If all of the above answers are "NO", then may proceed with Cephalosporin use.     Medications Prior to Admission  Medication Sig Dispense Refill Last Dose  . acetaminophen (TYLENOL) 325 MG tablet Take 650 mg by mouth every 6 (six) hours as needed for mild pain or headache.   Unknown at Unknown time  . cyclobenzaprine (FLEXERIL) 5 MG tablet Take 1 tablet (5 mg total) by mouth 3 (three) times daily as needed for muscle spasms (back). (Patient not taking: Reported on 07/09/2018) 20 tablet 0 Unknown at Unknown time  . Elastic Bandages & Supports (COMFORT FIT MATERNITY SUPP SM) MISC Wear as directed. 1 each 0 Taking  . loratadine (CLARITIN) 10 MG tablet Take 1 tablet (10 mg  total) by mouth daily. 30 tablet 11 Past Week at Unknown time  . Prenat-Fe Poly-Methfol-FA-DHA (VITAFOL ULTRA) 29-0.6-0.4-200 MG CAPS Take 1 capsule by mouth daily before breakfast. 90 capsule 4 07/15/2018 at Unknown time  . Prenatal-DSS-FeCb-FeGl-FA (CITRANATAL BLOOM) 90-1 MG TABS Take 1 tablet by mouth daily before breakfast. 90 tablet 4 Taking  . promethazine (PHENERGAN) 25 MG tablet Take 1 tablet (25 mg total) by mouth every 6 (six) hours as needed for nausea or vomiting (if not responding to zofran). 30 tablet 2 Unknown at Unknown time  . ranitidine (ZANTAC) 150 MG tablet Take 1 tablet (150 mg total) by mouth 2 (two) times daily. (Patient not taking: Reported on 07/09/2018) 60 tablet 5 Unknown at Unknown time     Review of Systems  All systems reviewed and negative except as stated in HPI  Physical Exam Blood pressure 125/65, pulse 75, temperature 98.1 F (36.7 C), temperature source Oral, resp. rate 17, weight 82.4 kg, last menstrual period 12/09/2017, SpO2 100 %. General appearance: alert, oriented, NAD Lungs: normal respiratory effort Heart: regular rate Abdomen: soft, non-tender; gravid, FH appropriate for GA Extremities: No calf swelling or tenderness Presentation: cephalic by bedside sono  Fetal monitoring: 135 bpm, mod variability, +acels, no decels  Uterine activity: Irregular  Exam by:: D Poe CNM  Prenatal labs: ABO, Rh: --/--/O POS, O POS Performed at [facility], [address] [place], [place] [address]  [phone](04/29 1957) Antibody: NEG (04/29 1959) Rubella: 2.12 (04/22 0900) RPR: Non Reactive (07/17 1047)  HBsAg: Negative (04/22 0900)  HIV: Non Reactive (07/17 1047)  GC/Chlamydia: Neg  GBS: Positive (09/12 1613)  2-hr GTT: Normal  Genetic screening:  Neg  Anatomy US: Normal   Prenatal Transfer Tool  Maternal Diabetes: No Genetic Screening: Normal Maternal Ultrasounds/Referrals: Normal Fetal Ultrasounds or other Referrals:  Referred to Materal Fetal Medicine   Maternal Substance Abuse:  No Significant Maternal Medications:  None Significant Maternal Lab Results: Lab values include: Group B Strep positive  Results for orders placed or performed during the hospital encounter of 07/23/18 (from the past 24 hour(s))  POCT fern test   Collection Time: 07/23/18  5:47 PM  Result Value Ref Range   POCT Fern Test Positive = ruptured amniotic membanes     Patient Active Problem List   Diagnosis Date Noted  . Group beta Strep positive 07/20/2018  . Abdominal pain affecting pregnancy 07/16/2018  .  Diarrhea during pregnancy 07/16/2018  . Nausea and vomiting of pregnancy, antepartum 03/04/2018  . Previous cesarean delivery affecting pregnancy   . Previous preterm delivery, antepartum, second trimester   . Obesity affecting pregnancy in second trimester   . Supervision of other normal pregnancy, antepartum 02/25/2018    Assessment: Christy [name] is a 32 y.o. [id number] at [redacted]w[redacted]d here for PROM.   #Labor: TOLAC. Expectant management. Patient wishes to avoid augmentation at present (husband arriving from [place] in <4 hours). Discussed if does not develop adequate ctx pattern will re-assess for augmentation later this evening.  #Pain: Declines epidural  #FWB: Cat I  #ID:  GBS+. Amoxicillin and PCN allergy that is ?high risk reaction. Sensitivities not available, will proceed with Vancomycin for prophylaxis.   #MOF: Breast #MOC: Vasectomy  #Circ:  Inpatient   De Hollingshead 07/23/2018, 6:21 PM

## 2018-07-24 ENCOUNTER — Inpatient Hospital Stay (HOSPITAL_COMMUNITY): Admitting: Anesthesiology

## 2018-07-24 ENCOUNTER — Encounter (HOSPITAL_COMMUNITY): Payer: Self-pay | Admitting: *Deleted

## 2018-07-24 DIAGNOSIS — O4202 Full-term premature rupture of membranes, onset of labor within 24 hours of rupture: Secondary | ICD-10-CM

## 2018-07-24 DIAGNOSIS — Z3A37 37 weeks gestation of pregnancy: Secondary | ICD-10-CM

## 2018-07-24 LAB — RPR: RPR: NONREACTIVE

## 2018-07-24 MED ORDER — PHENYLEPHRINE 40 MCG/ML (10ML) SYRINGE FOR IV PUSH (FOR BLOOD PRESSURE SUPPORT)
80.0000 ug | PREFILLED_SYRINGE | INTRAVENOUS | Status: DC | PRN
  Filled 2018-07-24: qty 5

## 2018-07-24 MED ORDER — DIPHENHYDRAMINE HCL 50 MG/ML IJ SOLN: 12.5000 mg | INTRAMUSCULAR | Status: DC | PRN

## 2018-07-24 MED ORDER — ZOLPIDEM TARTRATE 5 MG PO TABS: 5.0000 mg | ORAL_TABLET | Freq: Every evening | ORAL | Status: DC | PRN

## 2018-07-24 MED ORDER — ONDANSETRON HCL 4 MG PO TABS: 4.0000 mg | ORAL_TABLET | ORAL | Status: DC | PRN

## 2018-07-24 MED ORDER — PRENATAL MULTIVITAMIN CH
1.0000 | ORAL_TABLET | Freq: Every day | ORAL | Status: DC
  Administered 2018-07-24 – 2018-07-25 (×2): 1 via ORAL
  Filled 2018-07-24 (×2): qty 1

## 2018-07-24 MED ORDER — ACETAMINOPHEN 325 MG PO TABS
650.0000 mg | ORAL_TABLET | ORAL | Status: DC | PRN
  Administered 2018-07-24 – 2018-07-25 (×2): 650 mg via ORAL
  Filled 2018-07-24 (×2): qty 2

## 2018-07-24 MED ORDER — SENNOSIDES-DOCUSATE SODIUM 8.6-50 MG PO TABS
2.0000 | ORAL_TABLET | ORAL | Status: DC
  Administered 2018-07-24: 2 via ORAL
  Filled 2018-07-24: qty 2

## 2018-07-24 MED ORDER — ONDANSETRON HCL 4 MG/2ML IJ SOLN: 4.0000 mg | INTRAMUSCULAR | Status: DC | PRN

## 2018-07-24 MED ORDER — WITCH HAZEL-GLYCERIN EX PADS: 1.0000 "application " | MEDICATED_PAD | CUTANEOUS | Status: DC | PRN

## 2018-07-24 MED ORDER — SIMETHICONE 80 MG PO CHEW: 80.0000 mg | CHEWABLE_TABLET | ORAL | Status: DC | PRN

## 2018-07-24 MED ORDER — IBUPROFEN 600 MG PO TABS
600.0000 mg | ORAL_TABLET | Freq: Four times a day (QID) | ORAL | Status: DC
  Administered 2018-07-24 – 2018-07-25 (×5): 600 mg via ORAL
  Filled 2018-07-24 (×5): qty 1

## 2018-07-24 MED ORDER — INFLUENZA VAC SPLIT QUAD 0.5 ML IM SUSY: 0.5000 mL | PREFILLED_SYRINGE | INTRAMUSCULAR | Status: DC

## 2018-07-24 MED ORDER — MEASLES, MUMPS & RUBELLA VAC ~~LOC~~ INJ
0.5000 mL | INJECTION | Freq: Once | SUBCUTANEOUS | Status: DC
  Filled 2018-07-24: qty 0.5

## 2018-07-24 MED ORDER — EPHEDRINE 5 MG/ML INJ
10.0000 mg | INTRAVENOUS | Status: DC | PRN
  Filled 2018-07-24: qty 2

## 2018-07-24 MED ORDER — FENTANYL 2.5 MCG/ML BUPIVACAINE 1/10 % EPIDURAL INFUSION (WH - ANES)
14.0000 mL/h | INTRAMUSCULAR | Status: DC | PRN
  Filled 2018-07-24: qty 100

## 2018-07-24 MED ORDER — DIBUCAINE 1 % RE OINT: 1.0000 "application " | TOPICAL_OINTMENT | RECTAL | Status: DC | PRN

## 2018-07-24 MED ORDER — COCONUT OIL OIL: 1.0000 "application " | TOPICAL_OIL | Status: DC | PRN

## 2018-07-24 MED ORDER — DIPHENHYDRAMINE HCL 25 MG PO CAPS: 25.0000 mg | ORAL_CAPSULE | Freq: Four times a day (QID) | ORAL | Status: DC | PRN

## 2018-07-24 MED ORDER — BENZOCAINE-MENTHOL 20-0.5 % EX AERO
1.0000 "application " | INHALATION_SPRAY | CUTANEOUS | Status: DC | PRN
  Administered 2018-07-24: 1 via TOPICAL
  Filled 2018-07-24: qty 56

## 2018-07-24 MED ORDER — LACTATED RINGERS IV SOLN: 500.0000 mL | Freq: Once | INTRAVENOUS | Status: DC

## 2018-07-24 MED ORDER — TETANUS-DIPHTH-ACELL PERTUSSIS 5-2.5-18.5 LF-MCG/0.5 IM SUSP: 0.5000 mL | Freq: Once | INTRAMUSCULAR | Status: DC

## 2018-07-24 NOTE — Lactation Note (Signed)
Lactation Consultation Note  Patient Name: Christy HedgesStefani [name] NWGNF'AToday's Date: 07/24/2018 Reason for consult: Initial assessment;Early term 37-38.6wks P3, ETI, less than 1 hour in labor and delivery. Mom requesting help, infant not latching to breast used NS with previous children. Mom doesn't have Breast pump at home expecting one from her insurance carrier. Per mom, BF  previous children for 21/2 weeks and 21/2 years. Infant would not sustain latch in Labor and Delivery with out NS. LC used 20 mm NS and infant suckled and swallowed on left breast for 10 minutes. LC observed mom has short shafted flat nipples. LC discussed I&O. Reviewed Baby & Me book's Breastfeeding Basics.  LC discussed w/ mom BF according hunger cues, 8 to 12 times within 24 hours and will not exceed 3 hours without feeding infant. LC discussed importance of STS. Mom will call Nurse or LC for assistance w/ BF, or if she has any questions or concerns. LC discussed : LC outpatient clinic, BF support group, LC hotline and other BF community resources.    Maternal Data Formula Feeding for Exclusion: No Has patient been taught Hand Expression?: Yes(Mom demostrarted hand expression to [facility] Does the patient have breastfeeding experience prior to this delivery?: Yes  Feeding Feeding Type: Breast Fed Length of feed: 10 min  LATCH Score Latch: Repeated attempts needed to sustain latch, nipple held in mouth throughout feeding, stimulation needed to elicit sucking reflex.  Audible Swallowing: Spontaneous and intermittent  Type of Nipple: Flat(short shafted )  Comfort (Breast/Nipple): Soft / non-tender  Hold (Positioning): Assistance needed to correctly position infant at breast and maintain latch.  LATCH Score: 7  Interventions Interventions: Breast feeding basics reviewed;Adjust position;Assisted with latch;Support pillows;Skin to skin;Hand express;Breast compression  Lactation Tools Discussed/Used WIC Program:  No   Consult Status Consult Status: Follow-up Date: 07/24/18 Follow-up type: In-patient    Christy [name] 07/24/2018, 7:48 AM

## 2018-07-24 NOTE — Lactation Note (Signed)
This note was copied from a baby's chart. Lactation Consultation Note Baby 30 hrs old, hasn't fed since 1500 per mom. Mom stated baby BF well this morning, this evening has been sleepy and not interested.  Baby 30.4 lbs reviewed LPI information d/t less than 6 lbs and poor feeder.  Parents in agreement and want to supplement.  LC took DEBP into room. Discussed importance of pumping for stimulation and also supplementation. RN set up DEBP and instructed mom.  Mom BF her 1st child for 2 weeks, her 2nd child now 2 yrs old up to moms 30th month of gestation with this baby. Mom has pendulous breast, nipple at the bottom end of breast. Lt. Breast has dependent edema. [place] edema. Not compressible. Mom states has increased in swelling since this morning. Rt. Nipple short shaft, compresses flat d/t edema. More compressible but not enough to obtain deep latch. Reverse pressure some helpful. Shells given. Encouraged mom to put bra on and wear shells after feeding this evening.  Gave hand pump to pre-pump to evert nipple more.  Mom has #20 NS. Will not stay on Lt. Breast d/t edema. Appears tight on nipples. LC feels that baby will not open wide enough for #24 NS. Can't hardly get baby to open wide enough for #20 NS. 5 fr tube inserted into NS to stimulate to suck. Baby wouldn't suck. LC w/gloved finger attempted 5 fr and syring, baby wouldn't suck.   Baby is very sleepy. Has no interest in BF. Tongue thrust on gloved finger, sucked a couple of times then tongue thrust.  Mom wanted to latch on to bare RT. Nipple. Baby wouldn't latch and when compressed nipple flattens. Mom isn't understanding that the nipple needs to go back into mouth.  Baby not jittery. LC concerned about baby's size and not feeding. Discussed w/RN obtaining glucose. Since not jittery and glucose has been WDL will not get one at this time. Hand expressed a few dots and rubbed on gums.  Mom currently pumping w/colostrum on flange. Mom  upset that milk isn't pouring into bottle even though LC and RN has explained normal not to get anything.   Patient Name: Christy [name] Christy HedgesStefani [name] ZOXWR'UToday's Date: 07/24/2018 Reason for consult: Follow-up assessment;Infant < 30lbs;Early term 30-38.6wks   Maternal Data Has patient been taught Hand Expression?: Yes Does the patient have breastfeeding experience prior to this delivery?: Yes  Feeding    LATCH Score Latch: Too sleepy or reluctant, no latch achieved, no sucking elicited.  Audible Swallowing: None  Type of Nipple: Flat  Comfort (Breast/Nipple): Filling, red/small blisters or bruises, mild/mod discomfort(edema)  Hold (Positioning): Full assist, staff holds infant at breast  LATCH Score: 2  Interventions Interventions: Breast feeding basics reviewed;Support pillows;Assisted with latch;Position options;Skin to skin;Breast massage;Hand express;Expressed milk;Shells;Pre-pump if needed;Reverse pressure;Hand pump;DEBP;Breast compression;Adjust position  Lactation Tools Discussed/Used Tools: Shells;Pump;Nipple Shields;68F feeding tube / Syringe Nipple shield size: 20;24(24 to large) Shell Type: Inverted Breast pump type: Double-Electric Breast Pump;Manual Pump Review: Setup, frequency, and cleaning;Milk Storage Initiated by:: RN Date initiated:: 07/24/18   Consult Status Consult Status: Follow-up Date: 07/24/18 Follow-up type: In-patient    Charyl DancerCARVER, Sanad Fearnow G 07/24/2018, 10:01 PM

## 2018-07-24 NOTE — Progress Notes (Signed)
Patient ID: Linda HedgesStefani Hopf, female   DOB: 10-Nov-1985, 32 y.o.   MRN: [id number] FHR with prolonged decel to 80s Position changes and oxygen given with recovery  FSE applied Good variabilty  Dilation: 4 Effacement (%): 80 Station: -1 Presentation: Vertex Exam by:: e. poore, rnc  Good change in cervix now, laboring on her own  Will observe, If happens again, would insert IUPC and start amnioinfusion

## 2018-07-24 NOTE — Lactation Note (Signed)
This note was copied from a baby's chart. Lactation Consultation Note Spoke w/mom options of how to supplement.  5 fr, finger or to breast, spoon feeding to stimulate baby to feed, or bottle feeding as last resort if baby doesn't start suckling and use it for suck training and supplementing. Mom stated she is going to wait on baby to cue to eat. Discussed LC concerns that if baby hasn't cued after midnight to alert RN.  Encouraged STS and cont. To hand express and rub colostrum on baby's gums.  Patient Name: Christy [name] WJXBJ'YToday's Date: 07/24/2018 Reason for consult: Follow-up assessment;Infant < 6lbs;Early term 37-38.6wks   Maternal Data Has patient been taught Hand Expression?: Yes Does the patient have breastfeeding experience prior to this delivery?: Yes  Feeding    LATCH Score Latch: Too sleepy or reluctant, no latch achieved, no sucking elicited.  Audible Swallowing: None  Type of Nipple: Flat  Comfort (Breast/Nipple): Filling, red/small blisters or bruises, mild/mod discomfort(edema)  Hold (Positioning): Full assist, staff holds infant at breast  LATCH Score: 2  Interventions Interventions: Breast feeding basics reviewed;Support pillows;Assisted with latch;Position options;Skin to skin;Breast massage;Hand express;Expressed milk;Shells;Pre-pump if needed;Reverse pressure;Hand pump;DEBP;Breast compression;Adjust position  Lactation Tools Discussed/Used Tools: Shells;Pump;Nipple Shields;53F feeding tube / Syringe Nipple shield size: 20;24(24 to large) Shell Type: Inverted Breast pump type: Double-Electric Breast Pump;Manual Pump Review: Setup, frequency, and cleaning;Milk Storage Initiated by:: RN Date initiated:: 07/24/18   Consult Status Consult Status: Follow-up Date: 07/24/18 Follow-up type: In-patient    Christy [name], Christy [name] 07/24/2018, 10:27 PM

## 2018-07-24 NOTE — Progress Notes (Signed)
Patient ID: Linda HedgesStefani [name], female   DOB: Aug 11, 1986, 32 y.o.   MRN: [id number] Sleeping Vitals:   07/23/18 1711 07/23/18 1903 07/23/18 1922 07/23/18 2355  BP: 125/65 121/76 118/68 (!) 115/58  Pulse: 75 68 69 67  Resp: 17 18 16 16   Temp: 98.1 F (36.7 C) 98.2 F (36.8 C) 98.3 F (36.8 C)   TempSrc: Oral Oral Oral   SpO2: 100%     Weight: 82.4 kg      FHR stable, not tracing well lying on her side UCs not tracing well  Dilation: 1 Effacement (%): 50 Station: -3, -2 Presentation: Vertex Exam by:: Kaliya Shreiner CNM  Continues to request expectant management until morning Husband is here now  Will continue to observe, limit vaginal exams

## 2018-07-25 ENCOUNTER — Encounter: Admitting: Obstetrics and Gynecology

## 2018-07-25 MED ORDER — LIDOCAINE HCL (PF) 1 % IJ SOLN
INTRAMUSCULAR | Status: DC | PRN
  Administered 2018-07-24 (×2): 5 mL via EPIDURAL

## 2018-07-25 MED ORDER — IBUPROFEN 600 MG PO TABS: 600.0000 mg | ORAL_TABLET | Freq: Four times a day (QID) | ORAL | 0 refills | Status: DC

## 2018-07-25 NOTE — Lactation Note (Signed)
This note was copied from a baby's chart. Lactation Consultation Note Discussed supplementation w/baby again w/mom. Mom refuses at this time. States he has BF well and has several pee's and poops. Baby hasn't lost but 1 % wt. So mom doesn't want to supplement at this time. Mom is using DEBP, just getting colostrum around flange, wiping in mouth. Discussed w/RN.  Patient Name: Christy Linda HedgesStefani [name] ZOXWR'UToday's Date: 07/25/2018     Maternal Data    Feeding Feeding Type: Breast Fed Length of feed: 10 min  LATCH Score                   Interventions    Lactation Tools Discussed/Used     Consult Status      Calleen Alvis G 07/25/2018, 4:52 AM

## 2018-07-25 NOTE — Anesthesia Preprocedure Evaluation (Signed)

## 2018-07-25 NOTE — Discharge Instructions (Signed)
Vaginal Delivery, Care After °Refer to this sheet in the next few weeks. These instructions provide you with information about caring for yourself after vaginal delivery. Your health care provider may also give you more specific instructions. Your treatment has been planned according to current medical practices, but problems sometimes occur. Call your health care provider if you have any problems or questions. °What can I expect after the procedure? °After vaginal delivery, it is common to have: °· Some bleeding from your vagina. °· Soreness in your abdomen, your vagina, and the area of skin between your vaginal opening and your anus (perineum). °· Pelvic cramps. °· Fatigue. ° °Follow these instructions at home: °Medicines °· Take over-the-counter and prescription medicines only as told by your health care provider. °· If you were prescribed an antibiotic medicine, take it as told by your health care provider. Do not stop taking the antibiotic until it is finished. °Driving ° °· Do not drive or operate heavy machinery while taking prescription pain medicine. °· Do not drive for 24 hours if you received a sedative. °Lifestyle °· Do not drink alcohol. This is especially important if you are breastfeeding or taking medicine to relieve pain. °· Do not use tobacco products, including cigarettes, chewing tobacco, or e-cigarettes. If you need help quitting, ask your health care provider. °Eating and drinking °· Drink at least 8 eight-ounce glasses of water every day unless you are told not to by your health care provider. If you choose to breastfeed your baby, you may need to drink more water than this. °· Eat high-fiber foods every day. These foods may help prevent or relieve constipation. High-fiber foods include: °? Whole grain cereals and breads. °? Brown rice. °? Beans. °? Fresh fruits and vegetables. °Activity °· Return to your normal activities as told by your health care provider. Ask your health care provider  what activities are safe for you. °· Rest as much as possible. Try to rest or take a nap when your baby is sleeping. °· Do not lift anything that is heavier than your baby or 10 lb (4.5 kg) until your health care provider says that it is safe. °· Talk with your health care provider about when you can engage in sexual activity. This may depend on your: °? Risk of infection. °? Rate of healing. °? Comfort and desire to engage in sexual activity. °Vaginal Care °· If you have an episiotomy or a vaginal tear, check the area every day for signs of infection. Check for: °? More redness, swelling, or pain. °? More fluid or blood. °? Warmth. °? Pus or a bad smell. °· Do not use tampons or douches until your health care provider says this is safe. °· Watch for any blood clots that may pass from your vagina. These may look like clumps of dark red, brown, or black discharge. °General instructions °· Keep your perineum clean and dry as told by your health care provider. °· Wear loose, comfortable clothing. °· Wipe from front to back when you use the toilet. °· Ask your health care provider if you can shower or take a bath. If you had an episiotomy or a perineal tear during labor and delivery, your health care provider may tell you not to take baths for a certain length of time. °· Wear a bra that supports your breasts and fits you well. °· If possible, have someone help you with household activities and help care for your baby for at least a few days after   you leave the hospital. °· Keep all follow-up visits for you and your baby as told by your health care provider. This is important. °Contact a health care provider if: °· You have: °? Vaginal discharge that has a bad smell. °? Difficulty urinating. °? Pain when urinating. °? A sudden increase or decrease in the frequency of your bowel movements. °? More redness, swelling, or pain around your episiotomy or vaginal tear. °? More fluid or blood coming from your episiotomy or  vaginal tear. °? Pus or a bad smell coming from your episiotomy or vaginal tear. °? A fever. °? A rash. °? Little or no interest in activities you used to enjoy. °? Questions about caring for yourself or your baby. °· Your episiotomy or vaginal tear feels warm to the touch. °· Your episiotomy or vaginal tear is separating or does not appear to be healing. °· Your breasts are painful, hard, or turn red. °· You feel unusually sad or worried. °· You feel nauseous or you vomit. °· You pass large blood clots from your vagina. If you pass a blood clot from your vagina, save it to show to your health care provider. Do not flush blood clots down the toilet without having your health care provider look at them. °· You urinate more than usual. °· You are dizzy or light-headed. °· You have not breastfed at all and you have not had a menstrual period for 12 weeks after delivery. °· You have stopped breastfeeding and you have not had a menstrual period for 12 weeks after you stopped breastfeeding. °Get help right away if: °· You have: °? Pain that does not go away or does not get better with medicine. °? Chest pain. °? Difficulty breathing. °? Blurred vision or spots in your vision. °? Thoughts about hurting yourself or your baby. °· You develop pain in your abdomen or in one of your legs. °· You develop a severe headache. °· You faint. °· You bleed from your vagina so much that you fill two sanitary pads in one hour. °This information is not intended to replace advice given to you by your health care provider. Make sure you discuss any questions you have with your health care provider. °Document Released: 10/20/2000 Document Revised: 04/05/2016 Document Reviewed: 11/07/2015 °Elsevier Interactive Patient Education © 2018 [employer] Inc[employer] ° °

## 2018-07-25 NOTE — Progress Notes (Signed)
Due to high hospital census and acuity, CSW is unable to meet with MOB to complete assessment for Edinburgh Score of 11. PPD education will be provided to family during discharge teaching. Please consult CSW if concerns arise or by MOB's request.  Blaine HamperAngel Boyd-Gilyard, MSW, LCSW Clinical Social Work [phone]

## 2018-07-25 NOTE — Anesthesia Postprocedure Evaluation (Signed)
Anesthesia Post Note  Patient: Christy [name]  Procedure(s) Performed: AN AD HOC LABOR EPIDURAL     Patient location during evaluation: Mother Baby Anesthesia Type: Epidural Level of consciousness: awake and alert Pain management: pain level controlled Vital Signs Assessment: post-procedure vital signs reviewed and stable Respiratory status: spontaneous breathing, nonlabored ventilation and respiratory function stable Cardiovascular status: stable Postop Assessment: [name] headache, [name] backache and epidural receding Anesthetic complications: [name]    Last Vitals:  Vitals:   07/24/18 2130 07/25/18 0530  BP: 118/66 (!) 104/56  Pulse: 66 70  Resp: 19 18  Temp: 36.9 C 36.7 C  SpO2: 98% 99%    Last Pain:  Vitals:   07/25/18 0530  TempSrc: Oral  PainSc: 6                  Phillips Groutarignan, Karyme Mcconathy

## 2018-07-25 NOTE — Discharge Summary (Addendum)
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 yo [id number] at [redacted]w[redacted]d was admitted in Latent Labor on 07/23/2018. Patient had an uncomplicated labor course as follows:  Membrane Rupture Time/Date: 3:00 PM ,07/23/2018   Intrapartum Procedures: Episiotomy: None [1]                                         Lacerations:  None [1]  Patient had a delivery of a Viable infant. 07/24/2018  Information for the patient's newborn:  Avilyn, Virtue [[id number]]  Delivery Method: VBAC, Spontaneous(Filed from Delivery Summary)    Pateint had an uncomplicated postpartum course.  32 She is ambulating, tolerating a regular diet, passing flatus, and urinating well. Patient is discharged home in stable condition on 07/25/18 (rooming in).   Physical exam  Vitals:   07/24/18 1718 07/24/18 2130 07/25/18 0530 07/25/18 1512  BP: 120/69 118/66 (!) 104/56 121/72  Pulse: 62 66 70 72  Resp: 17 19 18    Temp: 98.5 F (36.9 C) 98.5 F (36.9 C) 98.1 F (36.7 C)   TempSrc: Oral Oral Oral   SpO2:  98% 99% 100%  Weight:       General: alert, cooperative and no distress Lochia: appropriate Uterine Fundus: firm Incision: N/A DVT Evaluation: No evidence of DVT seen on physical exam. Labs: Lab Results   Component Value Date   WBC 8.7 07/23/2018   HGB 11.4 (L) 07/23/2018   HCT 33.1 (L) 07/23/2018   MCV 79.2 07/23/2018   PLT 290 07/23/2018   CMP Latest Ref Rng & Units 06/24/2018  Glucose 70 - 99 mg/dL 84  BUN 6 - 20 mg/dL <1(H)  Creatinine 0.86 - 1.00 mg/dL 5.78  Sodium 469 - 629 mmol/L 138  Potassium 3.5 - 5.1 mmol/L 4.1  Chloride 98 - 111 mmol/L 110  CO2 22 - 32 mmol/L 23  Calcium 8.9 - 10.3 mg/dL 5.2(W)  Total Protein 6.5 - 8.1 g/dL 6.6  Total Bilirubin 0.3 - 1.2 mg/dL 0.4  Alkaline Phos 38 - 126 U/L 257(H)  AST 15 - 41 U/L 17  ALT 0 - 44 U/L 11    Discharge instruction: per After Visit Summary and "Baby and Me Booklet".  After visit meds:  Allergies as  of 07/25/2018      Reactions   Amoxicillin Hives   Penicillins Hives, Nausea And Vomiting   Has patient had a PCN reaction causing immediate rash, facial/tongue/throat swelling, SOB or lightheadedness with hypotension: Yes, no anaphylaxis reaction.  Has patient had a PCN reaction causing severe rash involving mucus membranes or skin necrosis: Yes Has patient had a PCN reaction that required hospitalization: No Has patient had a PCN reaction occurring within the last 10 years: Yes If all of the above answers are "NO", then may proceed with Cephalosporin use.      Medication List    TAKE these medications   acetaminophen 325 MG tablet Commonly known as:  TYLENOL Take 650 mg by mouth every 6 (six) hours as needed for mild pain or headache.   bismuth subsalicylate 262 MG/15ML suspension Commonly known as:  PEPTO BISMOL Take 30 mLs by mouth every 6 (six) hours as needed for indigestion.   COMFORT FIT MATERNITY SUPP SM Misc Wear as directed.   cyclobenzaprine 5 MG tablet Commonly known as:  FLEXERIL Take 1 tablet (5 mg total) by mouth 3 (three) times daily as needed for muscle spasms (back).   ibuprofen 600 MG tablet Commonly known as:  ADVIL,MOTRIN Take 1 tablet (600 mg total) by mouth every 6 (six) hours.    loratadine 10 MG tablet Commonly known as:  CLARITIN Take 1 tablet (10 mg total) by mouth daily.   prenatal multivitamin Tabs tablet Take 1 tablet by mouth daily at 12 noon.   promethazine 25 MG tablet Commonly known as:  PHENERGAN Take 1 tablet (25 mg total) by mouth every 6 (six) hours as needed for nausea or vomiting (if not responding to zofran).   ranitidine 150 MG tablet Commonly known as:  ZANTAC Take 1 tablet (150 mg total) by mouth 2 (two) times daily.       Diet: routine diet  Activity: Advance as tolerated. Pelvic rest for 6 weeks.   Outpatient follow up:6 weeks Follow up Appt: Future Appointments  Date Time Provider Department Center  08/22/2018 10:45 AM Conan Bowensavis, Kelly M, MD [facility] None   Follow up Visit:No follow-ups on file.  Postpartum contraception: Abstinence  Newborn Data: Live born female  Birth Weight: 5 lb 4.1 oz (2384 g) APGAR: 9, 9  Newborn Delivery   Birth date/time:  07/24/2018 06:02:00 Delivery type:  VBAC, Spontaneous     Baby Feeding: Breast Disposition:rooming in   07/25/2018 Denzil HughesKelsey D Ash, MD  Midwife attestation I have seen and examined this patient and agree with above documentation in the resident's note.   Linda HedgesStefani Ferrick is a 32 y.o. [phone] [id number] s/p SVD.  Pain is well controlled. Plan for birth control is abstinence (spouse leaving country). Method of Feeding: breast  PE:  Gen: well appearing Heart: reg rate Lungs: normal WOB Fundus firm Ext: no pain, no edema  Recent Labs    07/23/18 1856  HGB 11.4*  HCT 33.1*     Assessment S/p VBAC PPD # 1  Plan: - discharge home - postpartum care discussed - f/u clinic in 6 weeks for postpartum visit   Donette LarryMelanie Willette Mudry, CNM 4:05 PM

## 2018-07-25 NOTE — Anesthesia Procedure Notes (Signed)
Epidural Patient location during procedure: OB Start time: 07/24/2018 5:03 AM  Staffing Anesthesiologist: Phillips Groutarignan, Enez Monahan, MD Performed: anesthesiologist   Preanesthetic Checklist Completed: patient identified, site marked, surgical consent, pre-op evaluation, timeout performed, IV checked, risks and benefits discussed and monitors and equipment checked  Epidural Patient position: sitting Prep: DuraPrep Patient monitoring: heart rate, continuous pulse ox and blood pressure Approach: right paramedian Location: L3-L4 Injection technique: LOR saline  Needle:  Needle type: Tuohy  Needle gauge: 17 G Needle length: 9 cm and 9 Needle insertion depth: 6 cm Catheter type: closed end flexible Catheter size: 20 Guage Catheter at skin depth: 10 cm Test dose: negative  Assessment Events: blood not aspirated, injection not painful, no injection resistance, negative IV test and no paresthesia  Additional Notes Patient identified. Risks/Benefits/Options discussed with patient including but not limited to bleeding, infection, nerve damage, paralysis, failed block, incomplete pain control, headache, blood pressure changes, nausea, vomiting, reactions to medication both or allergic, itching and postpartum back pain. Confirmed with bedside nurse the patient's most recent platelet count. Confirmed with patient that they are not currently taking any anticoagulation, have any bleeding history or any family history of bleeding disorders. Patient expressed understanding and wished to proceed. All questions were answered. Sterile technique was used throughout the entire procedure. Please see nursing notes for vital signs. Test dose was given through epidural needle and negative prior to continuing to dose epidural or start infusion. Warning signs of high block given to the patient including shortness of breath, tingling/numbness in hands, complete motor block, or any concerning symptoms with instructions to  call for help. Patient was given instructions on fall risk and not to get out of bed. All questions and concerns addressed with instructions to call with any issues.

## 2018-07-26 ENCOUNTER — Ambulatory Visit: Payer: Self-pay

## 2018-07-26 NOTE — Lactation Note (Signed)
This note was copied from a baby's chart. Lactation Consultation Note  Patient Name: Boy Linda HedgesStefani Friesenhahn ZOXWR'UToday's Date: 07/26/2018 Reason for consult: Follow-up assessment;Infant < 6lbs;Early term 9437-38.6wks  Visited with P3 Mom of ET infant at 5% weight loss.  Baby <6 lbs.  Mom has been double pumping and offering her EBM by curved tip syringe after breast feeding baby using a 20 mm nipple shield.  Baby obtaining between 5-20 ml after each breast feeding.   Breasts filling, not engorged. Mom doesn't have a DEBP for home use.  Mom aware of DEBP rental at [employer].  Mom may purchase a pump.  Assembled pump parts to demonstrated a manual double pump.   Mom desires OP lactation follow-up.  Request sent.  Observed baby latching to breast, using 20 mm nipple shield.  Added pillow support and assisted Mom to use U hold with cross cradle hold.  Baby able to attain a deep areolar latch with swallows identified.  Mom very good with latching, and receptive to instruction. Mom taught how to  Pace bottle feed.  Mom to offer baby 20 ml EBM after breast feeding. Engorgement prevention and treatment reviewed.  Plan-  1-Breast feed at least every 3 hrs, STS, using nipple shield to assist in baby getting a deep latch to breast. 2- use alternate breast compression to increase milk transfer 3- Offer baby 20 ml of EBM after breastfeeding. 4- Pump both breasts 15-20 mins, adding hand expression and breast massage. 5- Follow-up with OP lactation, request sent for appt next Monday or Tuesday.  Consult Status Consult Status: Follow-up Date: 07/26/18 Follow-up type: Out-patient    Judee ClaraSmith, Givanni Staron E 07/26/2018, 10:46 AM

## 2018-07-26 NOTE — Progress Notes (Signed)
Patient ID: Linda HedgesStefani Navarrette, female   DOB: [birth year], 30 y.o.   MRN: [id number] Attending Circumcision Counseling Progress Note  Patient desires circumcision for her female infant.  Circumcision procedure details discussed, risks and benefits of procedure were also discussed.  These include but are not limited to: Benefits of circumcision in men include reduction in the rates of urinary tract infection (UTI), penile cancer, some sexually transmitted infections, penile inflammatory and retractile disorders, as well as easier hygiene.  Risks include bleeding , infection, injury of glans which may lead to penile deformity or urinary tract issues, unsatisfactory cosmetic appearance and other potential complications related to the procedure.  It was emphasized that this is an elective procedure.  Patient wants to proceed with circumcision; written informed consent obtained.  Will do circumcision soon, routine circumcision and post circumcision care ordered for the infant.  Magin Balbi L. Alysia PennaErvin, M.D. 07/26/2018 12:22 PM

## 2018-07-29 ENCOUNTER — Encounter (HOSPITAL_COMMUNITY): Payer: Self-pay | Admitting: Emergency Medicine

## 2018-07-29 ENCOUNTER — Other Ambulatory Visit: Payer: Self-pay

## 2018-07-29 ENCOUNTER — Emergency Department (HOSPITAL_COMMUNITY)
Admission: EM | Admit: 2018-07-29 | Discharge: 2018-07-30 | Disposition: A | Discharge status: Home / Self Care | Attending: Emergency Medicine | Admitting: Emergency Medicine

## 2018-07-29 ENCOUNTER — Emergency Department (HOSPITAL_COMMUNITY)

## 2018-07-29 DIAGNOSIS — R600 Localized edema: Secondary | ICD-10-CM | POA: Insufficient documentation

## 2018-07-29 DIAGNOSIS — Z79899 Other long term (current) drug therapy: Secondary | ICD-10-CM | POA: Insufficient documentation

## 2018-07-29 DIAGNOSIS — R0789 Other chest pain: Secondary | ICD-10-CM | POA: Insufficient documentation

## 2018-07-29 DIAGNOSIS — R0602 Shortness of breath: Secondary | ICD-10-CM | POA: Insufficient documentation

## 2018-07-29 DIAGNOSIS — R609 Edema, unspecified: Secondary | ICD-10-CM

## 2018-07-29 DIAGNOSIS — R079 Chest pain, unspecified: Secondary | ICD-10-CM

## 2018-07-29 LAB — BASIC METABOLIC PANEL
ANION GAP: 9 (ref 5–15)
BUN: 7 mg/dL (ref 6–20)
CALCIUM: 8.8 mg/dL — AB (ref 8.9–10.3)
CHLORIDE: 109 mmol/L (ref 98–111)
CO2: 23 mmol/L (ref 22–32)
Creatinine, Ser: 0.59 mg/dL (ref 0.44–1.00)
GFR calc Af Amer: >60 mL/min (ref >60)
GFR calc non Af Amer: >60 mL/min (ref >60)
GLUCOSE: 92 mg/dL (ref 70–99)
POTASSIUM: 3.6 mmol/L (ref 3.5–5.1)
Sodium: 141 mmol/L (ref 135–145)

## 2018-07-29 LAB — CBC
HEMATOCRIT: 35.2 % — AB (ref 36.0–46.0)
HEMOGLOBIN: 11.4 g/dL — AB (ref 12.0–15.0)
MCH: 26.8 pg (ref 26.0–34.0)
MCHC: 32.4 g/dL (ref 30.0–36.0)
MCV: 82.6 fL (ref 78.0–100.0)
Platelets: 279 10*3/uL (ref 150–400)
RBC: 4.26 MIL/uL (ref 3.87–5.11)
RDW: 14.4 % (ref 11.5–15.5)
WBC: 8.2 10*3/uL (ref 4.0–10.5)

## 2018-07-29 LAB — I-STAT TROPONIN, ED: Troponin i, poc: 0 ng/mL (ref 0.00–0.08)

## 2018-07-29 LAB — I-STAT BETA HCG BLOOD, ED (MC, WL, AP ONLY): I-stat hCG, quantitative: 88.4 m[IU]/mL — ABNORMAL HIGH (ref <5)

## 2018-07-29 NOTE — ED Provider Notes (Signed)
[facility] EMERGENCY DEPARTMENT Provider Note   CSN: [id number] Arrival date & time: 07/29/18  2233     History   Chief Complaint Chief Complaint  Patient presents with  . Shortness of Breath  . Chest Pain    HPI Christy [name] is a 32 y.o. female.  The history is provided by the patient.  She has history of sickle cell trait and comes in complaining of dyspnea, chest pain, leg swelling.  She had vaginal delivery 6 days ago, was discharged from the hospital 3 days ago.  She did have some ankle swelling during pregnancy which seem to be getting better, but she noted recurrence of ankle swelling 2 days ago.  Yesterday, she developed dyspnea and today at about 6 PM, she started having left-sided chest pain.  Pain was dull, pressure feeling which was worse with a deep breath.  She rated pain at 6/10.  There is no associated nausea or vomiting or diaphoresis.  She denies any headache.  She denies pregnancy-induced hypertension.  Of note, she is breast-feeding.  Past Medical History:  Diagnosis Date  . Anemia   . Preterm labor   . Sickle cell trait (HCC)   . Vaginal Pap smear, abnormal     Patient Active Problem List   Diagnosis Date Noted  . Premature rupture of membranes 07/23/2018  . Group beta Strep positive 07/20/2018  . Abdominal pain affecting pregnancy 07/16/2018  . Diarrhea during pregnancy 07/16/2018  . Nausea and vomiting of pregnancy, antepartum 03/04/2018  . Previous cesarean delivery affecting pregnancy   . Previous preterm delivery, antepartum, second trimester   . Obesity affecting pregnancy in second trimester   . Supervision of other normal pregnancy, antepartum 02/25/2018    Past Surgical History:  Procedure Laterality Date  . CESAREAN SECTION    . FIBULA FRACTURE SURGERY    . TIBIA FRACTURE SURGERY    . VULVA SURGERY     Hematoma  . WISDOM TOOTH EXTRACTION       OB History    Gravida  3   Para  3   Term  2   Preterm  1   AB      Living  3     SAB      TAB      Ectopic      Multiple  0   Live Births  1            Home Medications    Prior to Admission medications   Medication Sig Start Date End Date Taking? Authorizing Provider  acetaminophen (TYLENOL) 325 MG tablet Take 650 mg by mouth every 6 (six) hours as needed for mild pain or headache.    [provider]  bismuth subsalicylate (PEPTO BISMOL) 262 MG/15ML suspension Take 30 mLs by mouth every 6 (six) hours as needed for indigestion.    [provider]  cyclobenzaprine (FLEXERIL) 5 MG tablet Take 1 tablet (5 mg total) by mouth 3 (three) times daily as needed for muscle spasms (back). Patient not taking: Reported on 07/09/2018 03/05/18   Milo Bing, MD  Elastic Bandages & Supports (COMFORT FIT MATERNITY SUPP SM) MISC Wear as directed. 04/09/18   Brock Bad, MD  ibuprofen (ADVIL,MOTRIN) 600 MG tablet Take 1 tablet (600 mg total) by mouth every 6 (six) hours. 07/25/18   Denzil Hughes, MD  loratadine (CLARITIN) 10 MG tablet Take 1 tablet (10 mg total) by mouth daily. Patient not taking: Reported on 07/23/2018  02/25/18   Brock Bad, MD  Prenatal Vit-Fe Fumarate-FA (PRENATAL MULTIVITAMIN) TABS tablet Take 1 tablet by mouth daily at 12 noon.    [provider]  promethazine (PHENERGAN) 25 MG tablet Take 1 tablet (25 mg total) by mouth every 6 (six) hours as needed for nausea or vomiting (if not responding to zofran). Patient not taking: Reported on 07/23/2018 03/05/18   [place] Bing, MD  ranitidine (ZANTAC) 150 MG tablet Take 1 tablet (150 mg total) by mouth 2 (two) times daily. Patient not taking: Reported on 07/09/2018 05/27/18   Brock Bad, MD    Family History Family History  Problem Relation Age of Onset  . Diabetes Mother   . Diabetes Brother   . Diabetes Maternal Aunt   . Kidney disease Maternal Aunt   . Diabetes Paternal Grandmother   . Dementia Paternal Grandmother     Social  History Social History   Tobacco Use  . Smoking status: Never Smoker  . Smokeless tobacco: Never Used  Substance Use Topics  . Alcohol use: Never    Frequency: Never  . Drug use: Never     Allergies   Amoxicillin and Penicillins   Review of Systems Review of Systems  All other systems reviewed and are negative.    Physical Exam Updated Vital Signs BP 127/78   Pulse 67   Temp 98.7 F (37.1 C) (Oral)   Resp 16   Ht 5\' 1"  (1.549 m)   Wt 81.6 kg   SpO2 99%   BMI 34.01 kg/m   Physical Exam  Nursing note and vitals reviewed.  32 year old female, resting comfortably and in no acute distress. Vital signs are normal. Oxygen saturation is 99%, which is normal. Head is normocephalic and atraumatic. PERRLA, EOMI. Oropharynx is clear. Neck is nontender and supple without adenopathy or JVD. Back is nontender and there is no CVA tenderness. Lungs are clear without rales, wheezes, or rhonchi. Chest is nontender. Heart has regular rate and rhythm without murmur. Abdomen is soft, flat, nontender without masses or hepatosplenomegaly and peristalsis is normoactive. Extremities have 1-2+ edema, full range of motion is present. Skin is warm and dry without rash. Neurologic: Mental status is normal, cranial nerves are intact, there are no motor or sensory deficits.  ED Treatments / Results  Labs (all labs ordered are listed, but only abnormal results are displayed) Labs Reviewed  BASIC METABOLIC PANEL - Abnormal; Notable for the following components:      Result Value   Calcium 8.8 (*)    All other components within normal limits  CBC - Abnormal; Notable for the following components:   Hemoglobin 11.4 (*)    HCT 35.2 (*)    All other components within normal limits  URINALYSIS, ROUTINE W REFLEX MICROSCOPIC - Abnormal; Notable for the following components:   Hgb urine dipstick LARGE (*)    Leukocytes, UA SMALL (*)    Bacteria, UA RARE (*)    All other components within  normal limits  HEPATIC FUNCTION PANEL - Abnormal; Notable for the following components:   Albumin 3.2 (*)    Alkaline Phosphatase 210 (*)    All other components within normal limits  I-STAT BETA HCG BLOOD, ED ([facility], [facility], [facility] ONLY) - Abnormal; Notable for the following components:   I-stat hCG, quantitative 88.4 (*)    All other components within normal limits  BRAIN NATRIURETIC PEPTIDE  MAGNESIUM  I-STAT TROPONIN, ED    EKG EKG Interpretation  Date/Time:  Monday July 29 2018 22:42:55 EDT Ventricular Rate:  59 PR Interval:  136 QRS Duration: 70 QT Interval:  404 QTC Calculation: 399 R Axis:   73 Text Interpretation:  Sinus bradycardia Otherwise normal ECG No old tracing to compare Confirmed by Dione Booze ([id number]) on 07/29/2018 11:04:54 PM   Radiology Dg Chest 2 View  Result Date: 07/29/2018 CLINICAL DATA:  Chest pain and dyspnea EXAM: CHEST - 2 VIEW COMPARISON:  None. FINDINGS: The heart size and mediastinal contours are within normal limits. Lung volumes are slightly low with slight elevation of the right hemidiaphragm. There is atelectasis at the lung bases. No pulmonary consolidations or CHF. No effusion or pneumothoraces. The visualized skeletal structures are unremarkable. IMPRESSION: Low lung volumes with atelectasis at bases. No active pulmonary disease. Electronically Signed   By: Tollie Eth M.D.   On: 07/29/2018 23:25   Ct Angio Chest Pe W And/or Wo Contrast  Result Date: 07/30/2018 CLINICAL DATA:  32 year old female with worsening shortness of breath. Concern for pulmonary embolism. EXAM: CT ANGIOGRAPHY CHEST WITH CONTRAST TECHNIQUE: Multidetector CT imaging of the chest was performed using the standard protocol during bolus administration of intravenous contrast. Multiplanar CT image reconstructions and MIPs were obtained to evaluate the vascular anatomy. CONTRAST:  ISOVUE-370 IOPAMIDOL (ISOVUE-370) INJECTION 76% COMPARISON:  Chest radiograph dated 07/29/2018  FINDINGS: Cardiovascular: There is no cardiomegaly or pericardial effusion. The thoracic aorta is grossly unremarkable. No CT evidence of pulmonary embolism. Mediastinum/Nodes: No hilar or mediastinal adenopathy. Esophagus is grossly unremarkable. No mediastinal fluid collection. Lungs/Pleura: Lungs are clear. No pleural effusion or pneumothorax. Upper Abdomen: No acute abnormality. Musculoskeletal: No chest wall abnormality. No acute or significant osseous findings. Review of the MIP images confirms the above findings. IMPRESSION: No acute intrathoracic pathology. No CT evidence of pulmonary embolism. Electronically Signed   By: Elgie Collard M.D.   On: 07/30/2018 02:55    Procedures Procedures  Medications Ordered in ED Medications - No data to display   Initial Impression / Assessment and Plan / ED Course  I have reviewed the triage vital signs and the nursing notes.  Pertinent labs & imaging results that were available during my care of the patient were reviewed by me and considered in my medical decision making (see chart for details).  Chest pain, dyspnea, leg swelling in the immediate postpartum period.  Blood pressure is normal, so doubt preeclampsia, but will check liver functions and platelets and urinalysis looking for protein.  Also, patient is clearly at risk for pulmonary embolism.  D-dimer is not reliable in the immediate postpartum setting, so we will send for CT angiogram of chest.  ECG is normal.  Chest x-ray shows no evidence of cardiomegaly or pulmonary vascular congestion, so doubt peripartum cardiomyopathy.  However, will check BNP level.  She has a mild anemia which is unchanged from baseline.  Troponin is not detectable.  Old records are reviewed confirming recent admission for vaginal delivery of term pregnancy complicated by premature rupture of membranes.  CT angiogram shows no evidence of pulmonary embolism or pneumonia.  BNP has come back normal.  She is discharged  with prescription for low-dose furosemide, recommended acetaminophen for pain, follow-up with PCP in the next 2 days.  Return precautions discussed.  Final Clinical Impressions(s) / ED Diagnoses   Final diagnoses:  Shortness of breath  Chest pain, unspecified type  Peripheral edema    ED Discharge Orders         Ordered    furosemide (LASIX)  20 MG tablet  Daily     07/30/18 0333           Dione BoozeGlick, Afton Lavalle, MD 07/30/18 [phone]

## 2018-07-29 NOTE — ED Notes (Signed)
Pt reports generalized swelling or "puffiness" in her legs and arms, shortness of breath, chest pressure with exertsion

## 2018-07-29 NOTE — ED Triage Notes (Signed)
Pt reports SOB and chest pain that started a "few days ago" but gotten worse today. Pt gave birth 4 days ago, reports swollen legs as well.

## 2018-07-29 NOTE — ED Notes (Signed)
ED Provider at bedside. 

## 2018-07-30 ENCOUNTER — Emergency Department (HOSPITAL_COMMUNITY)

## 2018-07-30 ENCOUNTER — Ambulatory Visit (HOSPITAL_COMMUNITY): Payer: TRICARE For Life (TFL)

## 2018-07-30 LAB — URINALYSIS, ROUTINE W REFLEX MICROSCOPIC
BILIRUBIN URINE: NEGATIVE
GLUCOSE, UA: NEGATIVE mg/dL
KETONES UR: NEGATIVE mg/dL
NITRITE: NEGATIVE
Protein, ur: NEGATIVE mg/dL
Specific Gravity, Urine: 1.012 (ref 1.005–1.030)
pH: 6 (ref 5.0–8.0)

## 2018-07-30 LAB — BRAIN NATRIURETIC PEPTIDE: B Natriuretic Peptide: 96.5 pg/mL (ref 0.0–100.0)

## 2018-07-30 LAB — HEPATIC FUNCTION PANEL
ALK PHOS: 210 U/L — AB (ref 38–126)
ALT: 16 U/L (ref 0–44)
AST: 22 U/L (ref 15–41)
Albumin: 3.2 g/dL — ABNORMAL LOW (ref 3.5–5.0)
Total Bilirubin: 0.6 mg/dL (ref 0.3–1.2)
Total Protein: 7.3 g/dL (ref 6.5–8.1)

## 2018-07-30 LAB — MAGNESIUM: Magnesium: 2 mg/dL (ref 1.7–2.4)

## 2018-07-30 MED ORDER — FUROSEMIDE 20 MG PO TABS: 20.0000 mg | ORAL_TABLET | Freq: Every day | ORAL | 0 refills | Status: DC

## 2018-07-30 MED ORDER — IOPAMIDOL (ISOVUE-370) INJECTION 76%
100.0000 mL | Freq: Once | INTRAVENOUS | Status: AC | PRN
  Administered 2018-07-30: 100 mL via INTRAVENOUS

## 2018-07-30 MED ORDER — IOPAMIDOL (ISOVUE-370) INJECTION 76%
INTRAVENOUS | Status: AC
  Filled 2018-07-30: qty 100

## 2018-07-30 NOTE — Discharge Instructions (Signed)
Return if symptoms are getting worse. °

## 2018-07-30 NOTE — ED Notes (Signed)
Patient Alert and oriented to baseline. Stable and ambulatory to baseline. Patient verbalized understanding of the discharge instructions.  Patient belongings were taken by the patient.   

## 2018-08-01 ENCOUNTER — Encounter: Admitting: Obstetrics and Gynecology

## 2018-08-01 ENCOUNTER — Telehealth: Payer: Self-pay

## 2018-08-01 NOTE — Telephone Encounter (Signed)
Spoke with pt about rx for breats pump, advised would contact [employer] to provide requested info.

## 2018-08-06 ENCOUNTER — Ambulatory Visit (HOSPITAL_COMMUNITY)

## 2018-08-22 ENCOUNTER — Ambulatory Visit: Admitting: Obstetrics and Gynecology

## 2018-08-29 ENCOUNTER — Ambulatory Visit (HOSPITAL_COMMUNITY): Payer: TRICARE For Life (TFL)

## 2018-09-05 ENCOUNTER — Ambulatory Visit (INDEPENDENT_AMBULATORY_CARE_PROVIDER_SITE_OTHER): Admitting: Certified Nurse Midwife

## 2018-09-05 ENCOUNTER — Ambulatory Visit (HOSPITAL_COMMUNITY)

## 2018-09-05 ENCOUNTER — Encounter: Payer: Self-pay | Admitting: Certified Nurse Midwife

## 2018-09-05 DIAGNOSIS — Z789 Other specified health status: Secondary | ICD-10-CM

## 2018-09-05 MED ORDER — BREASTMILK STORAGE BAGS MISC: 1.0000 | 0 refills | Status: DC

## 2018-09-05 NOTE — Progress Notes (Signed)
Post Partum Exam  Christy [name] is a 32 y.o. [phone] female who presents for a postpartum visit. She is 6 weeks postpartum following a spontaneous vaginal delivery. I have fully reviewed the prenatal and intrapartum course. The delivery was at 37 gestational weeks.  Anesthesia: epidural. Postpartum course has been doing well. Baby's course has been doing well. Baby is feeding by breast. Bleeding thin lochia. Bowel function is normal. Bladder function is normal. Patient is not sexually active. Contraception method is abstinence. Partner is planning a Vasectomy, is currently stationed in [place] and has vasectomy scheduled next month while stationed in [place]. Postpartum depression screening:neg, score 2.  The following portions of the patient's history were reviewed and updated as appropriate: allergies, current medications, past social history, past surgical history and problem list. Last pap smear done 02/2018 and was Normal  Review of Systems Pertinent items noted in HPI and remainder of comprehensive ROS otherwise negative.    Objective:  Blood pressure 115/77, pulse 60, weight 165 lb (74.8 kg), unknown if currently breastfeeding.  General:  alert, cooperative and no distress   Breasts:  negative  Lungs: clear to auscultation bilaterally  Heart:  regular rate and rhythm and S1, S2 normal  Abdomen: soft, non-tender; bowel sounds normal; no masses,  no organomegaly   Vulva:  not evaluated  Vagina: not evaluated  Cervix:  not evaluated  Corpus: not examined  Adnexa:  not evaluated  Rectal Exam: Not performed.        Assessment:    Normal postpartum exam. Pap smear not done at today's visit- due April 2022.   Plan:   1. Contraception: abstinence. Husband has vasectomy scheduled  2. Follow up in: 6 months for annual GYN examination or as needed. 3. Prescription written for [employer] to cover additional breastmilk storage bags   Sharyon Cable, CNM

## 2019-07-22 ENCOUNTER — Emergency Department (HOSPITAL_COMMUNITY)
Admission: EM | Admit: 2019-07-22 | Discharge: 2019-07-22 | Discharge status: Against Medical Advice | Attending: Emergency Medicine | Admitting: Emergency Medicine

## 2019-07-22 ENCOUNTER — Emergency Department (HOSPITAL_COMMUNITY)

## 2019-07-22 ENCOUNTER — Other Ambulatory Visit: Payer: Self-pay

## 2019-07-22 ENCOUNTER — Encounter (HOSPITAL_COMMUNITY): Payer: Self-pay | Admitting: Emergency Medicine

## 2019-07-22 DIAGNOSIS — Z5321 Procedure and treatment not carried out due to patient leaving prior to being seen by health care provider: Secondary | ICD-10-CM | POA: Insufficient documentation

## 2019-07-22 DIAGNOSIS — N644 Mastodynia: Secondary | ICD-10-CM | POA: Diagnosis present

## 2019-07-22 LAB — URINALYSIS, ROUTINE W REFLEX MICROSCOPIC
Bilirubin Urine: NEGATIVE
Glucose, UA: NEGATIVE mg/dL
Hgb urine dipstick: NEGATIVE
Ketones, ur: 20 mg/dL — AB
Leukocytes,Ua: NEGATIVE
Nitrite: NEGATIVE
Protein, ur: NEGATIVE mg/dL
Specific Gravity, Urine: 1.016 (ref 1.005–1.030)
pH: 5 (ref 5.0–8.0)

## 2019-07-22 LAB — CBC WITH DIFFERENTIAL/PLATELET
Abs Immature Granulocytes: 0.06 10*3/uL (ref 0.00–0.07)
Basophils Absolute: 0 10*3/uL (ref 0.0–0.1)
Basophils Relative: 0 %
Eosinophils Absolute: 0.1 10*3/uL (ref 0.0–0.5)
Eosinophils Relative: 0 %
HCT: 39.2 % (ref 36.0–46.0)
Hemoglobin: 13.3 g/dL (ref 12.0–15.0)
Immature Granulocytes: 0 %
Lymphocytes Relative: 4 %
Lymphs Abs: 0.6 10*3/uL — ABNORMAL LOW (ref 0.7–4.0)
MCH: 28.5 pg (ref 26.0–34.0)
MCHC: 33.9 g/dL (ref 30.0–36.0)
MCV: 84.1 fL (ref 80.0–100.0)
Monocytes Absolute: 0.9 10*3/uL (ref 0.1–1.0)
Monocytes Relative: 5 %
Neutro Abs: 14.9 10*3/uL — ABNORMAL HIGH (ref 1.7–7.7)
Neutrophils Relative %: 91 %
Platelets: 242 10*3/uL (ref 150–400)
RBC: 4.66 MIL/uL (ref 3.87–5.11)
RDW: 12.8 % (ref 11.5–15.5)
WBC: 16.5 10*3/uL — ABNORMAL HIGH (ref 4.0–10.5)
nRBC: 0 % (ref 0.0–0.2)

## 2019-07-22 LAB — COMPREHENSIVE METABOLIC PANEL
ALT: 59 U/L — ABNORMAL HIGH (ref 0–44)
AST: 55 U/L — ABNORMAL HIGH (ref 15–41)
Albumin: 4.3 g/dL (ref 3.5–5.0)
Alkaline Phosphatase: 138 U/L — ABNORMAL HIGH (ref 38–126)
Anion gap: 13 (ref 5–15)
BUN: 9 mg/dL (ref 6–20)
CO2: 21 mmol/L — ABNORMAL LOW (ref 22–32)
Calcium: 9.1 mg/dL (ref 8.9–10.3)
Chloride: 105 mmol/L (ref 98–111)
Creatinine, Ser: 0.77 mg/dL (ref 0.44–1.00)
GFR calc Af Amer: >60 mL/min (ref >60)
GFR calc non Af Amer: >60 mL/min (ref >60)
Glucose, Bld: 114 mg/dL — ABNORMAL HIGH (ref 70–99)
Potassium: 3.6 mmol/L (ref 3.5–5.1)
Sodium: 139 mmol/L (ref 135–145)
Total Bilirubin: 0.9 mg/dL (ref 0.3–1.2)
Total Protein: 8.3 g/dL — ABNORMAL HIGH (ref 6.5–8.1)

## 2019-07-22 LAB — PROTIME-INR
INR: 1.1 (ref 0.8–1.2)
Prothrombin Time: 13.6 seconds (ref 11.4–15.2)

## 2019-07-22 LAB — LACTIC ACID, PLASMA: Lactic Acid, Venous: 1.5 mmol/L (ref 0.5–1.9)

## 2019-07-22 LAB — I-STAT BETA HCG BLOOD, ED (MC, WL, AP ONLY): I-stat hCG, quantitative: <5 m[IU]/mL (ref <5)

## 2019-07-22 NOTE — ED Triage Notes (Signed)
Onset 3 days ago patient breast feeding started to have right breast pain and worsening overtime. Pain currently 10/10 sharp.

## 2019-07-27 LAB — CULTURE, BLOOD (ROUTINE X 2): Culture: NO GROWTH

## 2019-08-16 IMAGING — US US MFM OB DETAIL+14 WK
1 series · 13 of 28 positions shown · non-contrast
Comparison: none

[Series 1: us mfm ob detail+14 wk · 13 of 95 slices shown]
[im 4/95]
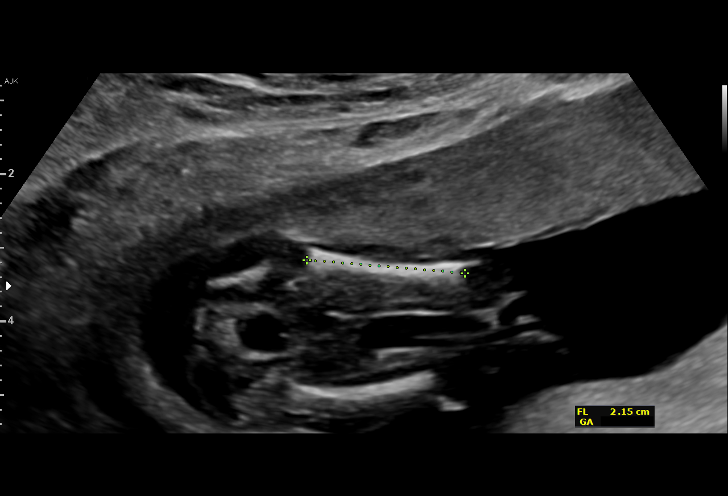
[im 11/95]
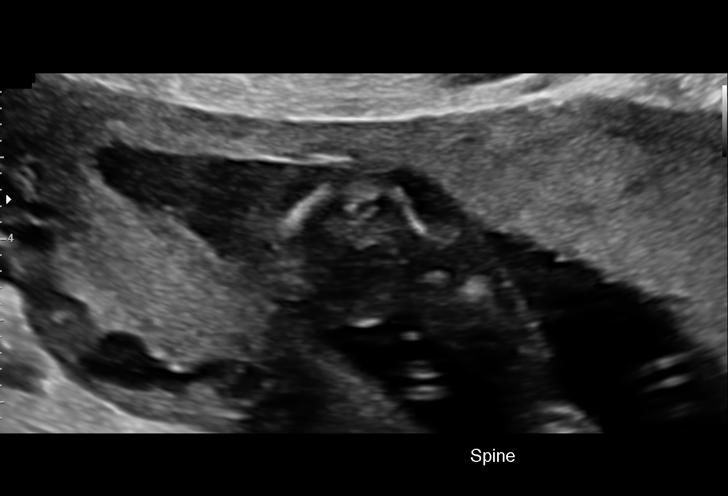
[im 18/95]
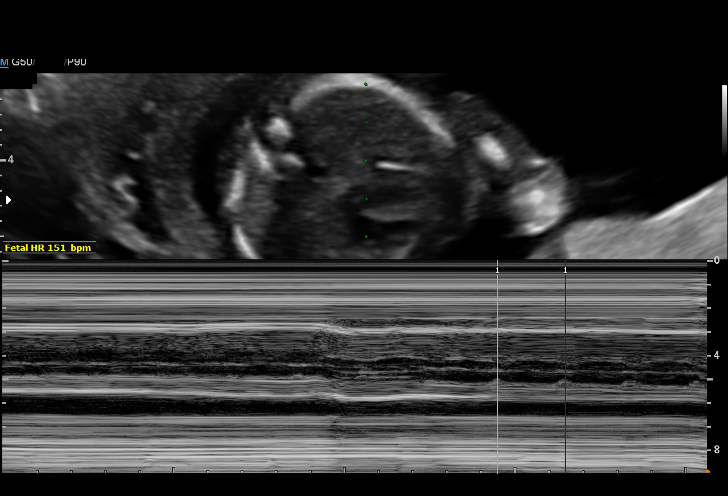
[im 25/95]
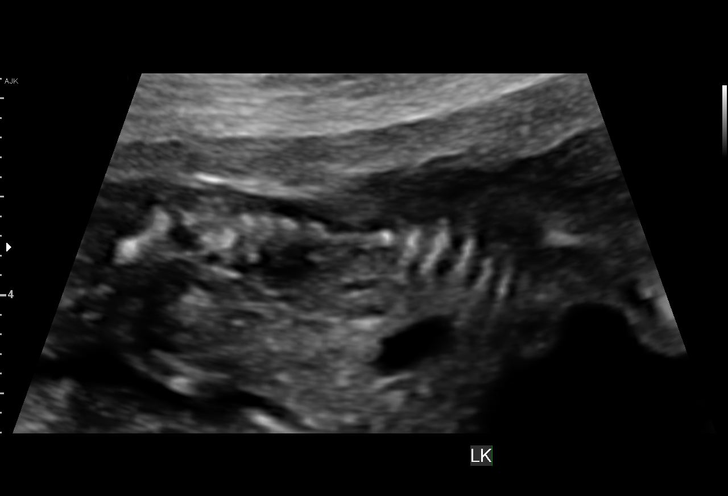
[im 32/95]
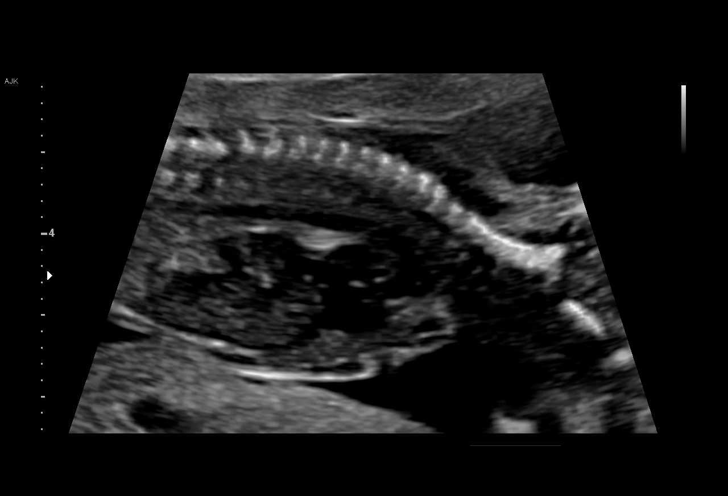
[im 39/95]
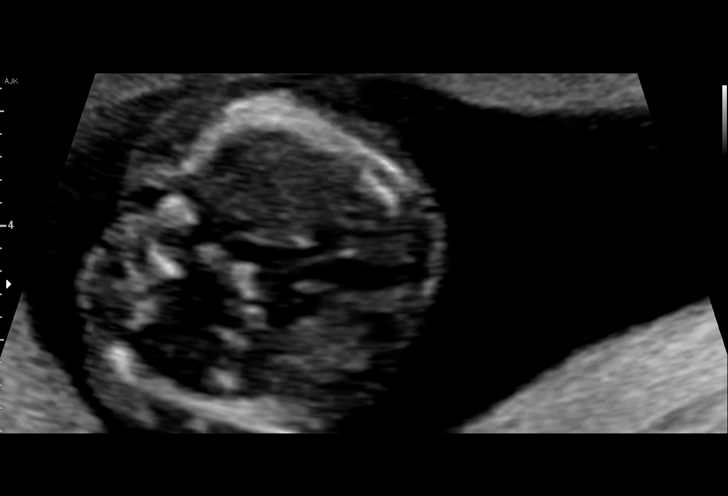
[im 49/95]
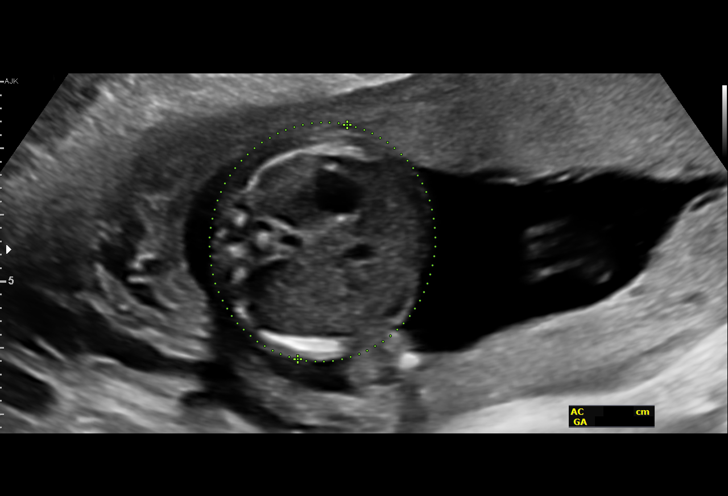
[im 56/95]
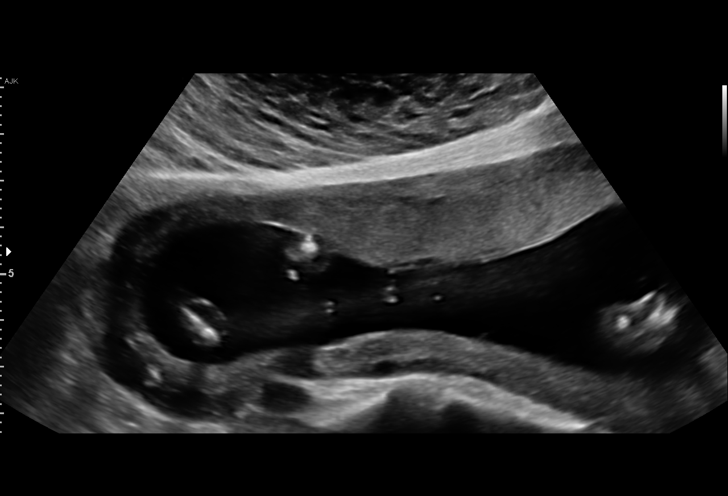
[im 63/95]
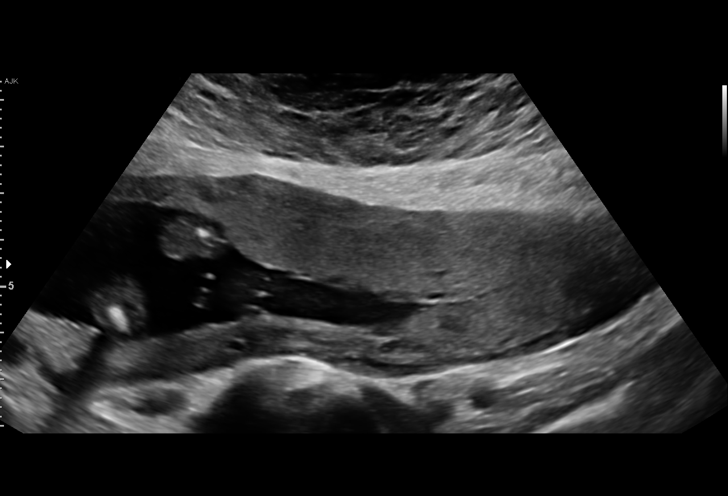
[im 70/95]
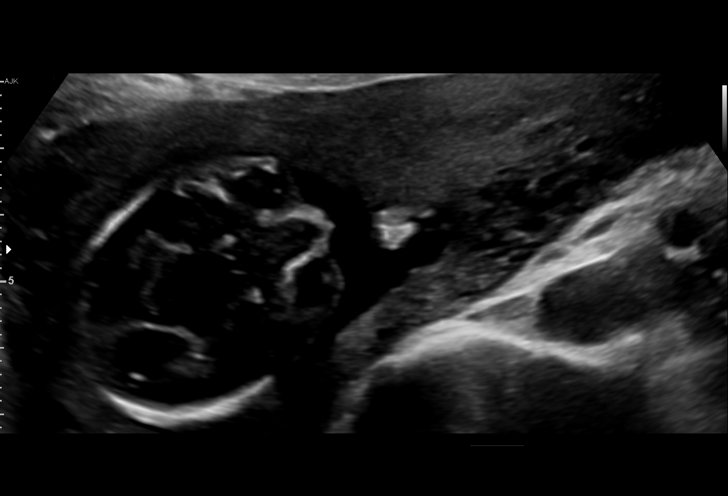
[im 77/95]
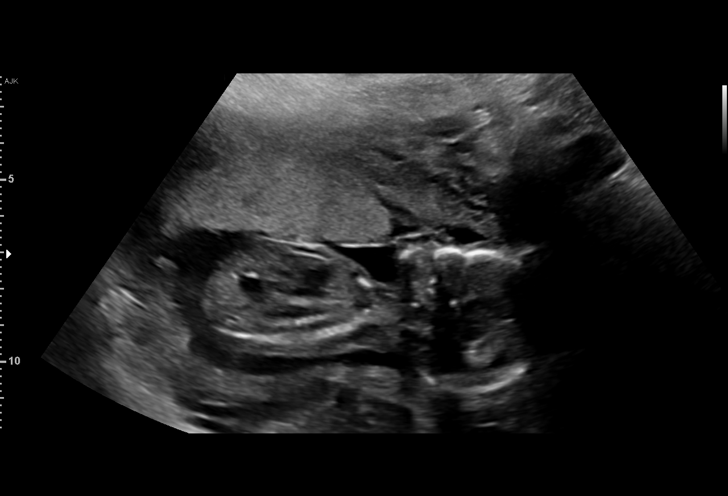
[im 84/95]
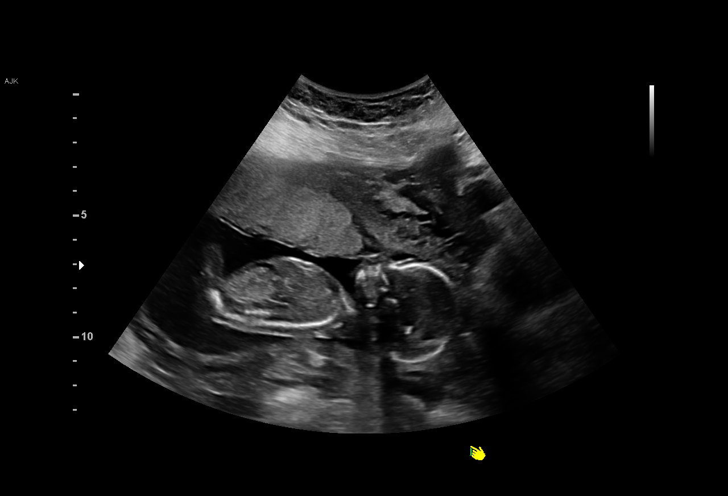
[im 91/95]
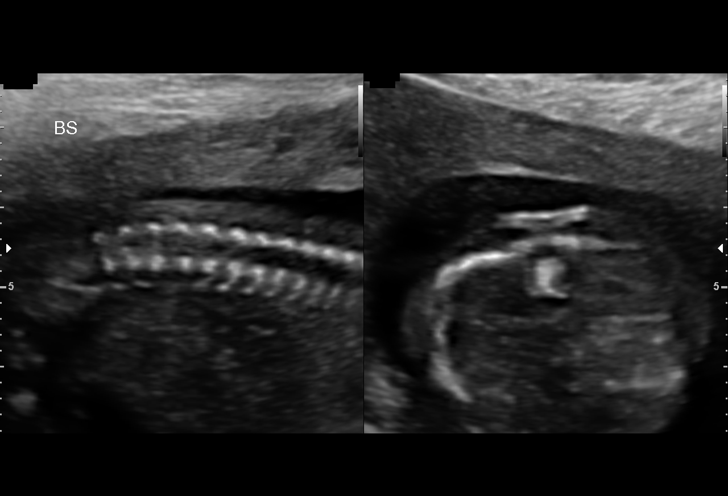

[13 of 28 positions shown; findings below may reference images not displayed]

am)

1  NEYMET ANYUSA           [id number]      [id number]     [id number]
Indications

16 weeks gestation of pregnancy
Encounter for antenatal screening for
malformations
Obesity complicating pregnancy, second
trimester
Encounter for uncertain dates
Poor obstetric history: Previous preterm
delivery, antepartum (? weeks. Due to "low
fluid and small baby" per pt)
Previous cesarean delivery, antepartum
OB History

Blood Type:            Height:  5'1"   Weight (lb):  188       BMI:
Gravidity:    2         Term:   0        Prem:   1        SAB:   0
TOP:          0       Ectopic:  0        Living: 1
Fetal Evaluation

Num Of Fetuses:     1
Fetal Heart         151
Rate(bpm):
Cardiac Activity:   Observed
Presentation:       Cephalic
Placenta:           Anterior, above cervical os
P. Cord Insertion:  Visualized

Amniotic Fluid
AFI FV:      Subjectively within normal limits
Largest Pocket(cm)
3.4
Biometry

BPD:      32.8  mm     G. Age:  16w 2d         36  %    CI:        73.46   %    70 - 86
FL/HC:      17.7   %    13.3 -
HC:      121.6  mm     G. Age:  16w 0d         21  %    HC/AC:      1.12        1.05 -
AC:      108.9  mm     G. Age:  16w 6d         62  %    FL/BPD:     65.5   %
FL:       21.5  mm     G. Age:  16w 3d         45  %    FL/AC:      19.7   %    20 - 24

Est. FW:     160  gm      0 lb 6 oz     64  %
Gestational Age

LMP:           11w 3d        Date:  12/09/17                 EDD:   09/15/18
U/S Today:     16w 3d                                        EDD:   08/11/18
Best:          16w 3d     Det. By:  U/S (02/27/18)           EDD:   08/11/18
Anatomy

Cranium:               Appears normal         Aortic Arch:            Appears normal
Cavum:                 Appears normal         Ductal Arch:            Appears normal
Ventricles:            Not well visualized    Diaphragm:              Appears normal
Choroid Plexus:        Appears normal         Stomach:                Appears normal, left
sided
Cerebellum:            Not well visualized    Abdomen:                Appears normal
Posterior Fossa:       Not well visualized    Abdominal Wall:         Appears nml (cord
insert, abd wall)
Nuchal Fold:           Not well visualized    Cord Vessels:           Appears normal (3
vessel cord)
Face:                  Orbits nl; profile not Kidneys:                Appear normal
well visualized
Lips:                  Appears normal         Bladder:                Appears normal
Thoracic:              Appears normal         Spine:                  Appears normal
Heart:                 Not well visualized    Upper Extremities:      Appears normal
RVOT:                  Appears normal         Lower Extremities:      Appears normal
LVOT:                  Not well visualized

Other:  Fetus appears to be a male. 5th digit visualized. Technically difficult
due to fetal position and early gestational age.
Cervix Uterus Adnexa

Cervix
Length:            4.3  cm.
Normal appearance by transabdominal scan.

Uterus
No abnormality visualized.

Left Ovary
Not visualized.

Right Ovary
Not visualized.

Adnexa:       No abnormality visualized.
Impression

Single living intrauterine pregnancy at 16w 3d.
Placenta Anterior, above cervical os.
Appropriate fetal growth.
Normal amniotic fluid volume.
The fetal anatomic survey is not complete.
No gross fetal anomalies identified.
The cervix measures 4.3cm transabdominally without
funneling.
The adnexa appear normal bilaterally without masses.
Recommendations

Recommend follow up attempt to complete survey in 4 weeks.

## 2021-03-20 ENCOUNTER — Encounter: Payer: Self-pay | Admitting: Emergency Medicine

## 2021-03-20 ENCOUNTER — Other Ambulatory Visit: Payer: Self-pay

## 2021-03-20 ENCOUNTER — Ambulatory Visit
Admission: EM | Admit: 2021-03-20 | Discharge: 2021-03-20 | Disposition: A | Discharge status: Home / Self Care | Attending: Emergency Medicine | Admitting: Emergency Medicine

## 2021-03-20 DIAGNOSIS — M7662 Achilles tendinitis, left leg: Secondary | ICD-10-CM | POA: Diagnosis not present

## 2021-03-20 MED ORDER — NAPROXEN 500 MG PO TABS: 500.0000 mg | ORAL_TABLET | Freq: Two times a day (BID) | ORAL | 0 refills | Status: DC

## 2021-03-20 NOTE — Discharge Instructions (Signed)
Ice and elevate Achilles after activity Naprosyn twice daily with food for the next week May use Ace wrap for comfort Gradually ease into activity Please try Achilles stretches attached and back twice daily  Follow-up if not improving or worsening

## 2021-03-20 NOTE — ED Triage Notes (Signed)
Pt here for pain in back of left ankle when running; pt sts started last week while jogging

## 2021-03-20 NOTE — ED Provider Notes (Signed)
[facility]    CSN: [id number] Arrival date & time: 03/20/21  1212      History   Chief Complaint Chief Complaint  Patient presents with  . Ankle Pain    HPI Christy [name] is a 35 y.o. female presenting today for evaluation of left ankle pain.  Reports over the past week she has had discomfort around her left Achilles.  Denies specific injury or trauma, but reports pain in the area especially with sprints and higher intensity activities.  She denies pain through the foot.  Denies history of similar.  Has been using KT tape and occasional ibuprofen without full relief.  Reports tolerates jogging okay.  HPI  Past Medical History:  Diagnosis Date  . Anemia   . Diarrhea during pregnancy 07/16/2018  . Nausea and vomiting of pregnancy, antepartum 03/04/2018  . Preterm labor   . Sickle cell trait (HCC)   . Supervision of other normal pregnancy, antepartum 02/25/2018    Nursing Staff Provider Office Location  [facility]  Dating  U/S Language  English Anatomy US  02-27-18 Flu Vaccine   Genetic Screen  NIPS:   AFP:   First Screen:  Quad:   TDaP vaccine   05-27-18 Hgb A1C or  GTT Early  Third trimester  Rhogam     LAB RESULTS  Feeding Plan Breast  Blood Type --/--/O POS, O POS Performed at [facility], [address], [place], [place] [address]  [phone] 1959)  Contra  . Vaginal Pap smear, abnormal     Patient Active Problem List   Diagnosis Date Noted  . Premature rupture of membranes 07/23/2018  . Group beta Strep positive 07/20/2018  . Abdominal pain affecting pregnancy 07/16/2018  . Previous cesarean delivery affecting pregnancy   . Previous preterm delivery, antepartum, second trimester   . Obesity affecting pregnancy in second trimester     Past Surgical History:  Procedure Laterality Date  . CESAREAN SECTION    . FIBULA FRACTURE SURGERY    . TIBIA FRACTURE SURGERY    . VULVA SURGERY     Hematoma  . WISDOM TOOTH EXTRACTION      OB History    Gravida   3   Para  3   Term  2   Preterm  1   AB      Living  3     SAB      IAB      Ectopic      Multiple  0   Live Births  1            Home Medications    Prior to Admission medications   Medication Sig Start Date End Date Taking? Authorizing Provider  naproxen (NAPROSYN) 500 MG tablet Take 1 tablet (500 mg total) by mouth 2 (two) times daily. 03/20/21  Yes Keelynn Furgerson C, PA-C  acetaminophen (TYLENOL) 325 MG tablet Take 650 mg by mouth every 6 (six) hours as needed for mild pain or headache.    [provider]  bismuth subsalicylate (PEPTO BISMOL) 262 MG/15ML suspension Take 30 mLs by mouth every 6 (six) hours as needed for indigestion.    [provider]  Elastic Bandages & Supports (COMFORT FIT MATERNITY SUPP SM) MISC Wear as directed. Patient not taking: Reported on 09/05/2018 04/09/18   Brock Bad, MD  Feeding Supplies (BREASTMILK STORAGE BAGS) MISC 1 Bag by Does not apply route every 3 (three) hours. 09/05/18   Sharyon Cable, CNM  montelukast (SINGULAIR) 10 MG tablet Take 10 mg by mouth at bedtime.    [provider]  Prenatal Vit-Fe Fumarate-FA (PRENATAL MULTIVITAMIN) TABS tablet Take 1 tablet by mouth daily at 12 noon.    [provider]  furosemide (LASIX) 20 MG tablet Take 1 tablet (20 mg total) by mouth daily. Patient not taking: Reported on 09/05/2018 07/30/18 03/20/21  Dione Booze, MD    Family History Family History  Problem Relation Age of Onset  . Diabetes Mother   . Diabetes Brother   . Diabetes Maternal Aunt   . Kidney disease Maternal Aunt   . Diabetes Paternal Grandmother   . Dementia Paternal Grandmother     Social History Social History   Tobacco Use  . Smoking status: Never Smoker  . Smokeless tobacco: Never Used  Vaping Use  . Vaping Use: Never used  Substance Use Topics  . Alcohol use: Never  . Drug use: Never     Allergies   Amoxicillin and Penicillins   Review of  Systems Review of Systems  Constitutional: Negative for fatigue and fever.  Eyes: Negative for visual disturbance.  Respiratory: Negative for shortness of breath.   Cardiovascular: Negative for chest pain.  Gastrointestinal: Negative for abdominal pain, nausea and vomiting.  Musculoskeletal: Positive for gait problem and myalgias. Negative for arthralgias and joint swelling.  Skin: Negative for color change, rash and wound.  Neurological: Negative for dizziness, weakness, light-headedness and headaches.     Physical Exam Triage Vital Signs ED Triage Vitals [03/20/21 1220]  Enc Vitals Group     BP 127/83     Pulse Rate 72     Resp 16     Temp 97.7 F (36.5 C)     Temp Source Oral     SpO2 99 %     Weight      Height      Head Circumference      Peak Flow      Pain Score      Pain Loc      Pain Edu?      Excl. in GC?    No data found.  Updated Vital Signs BP 127/83 (BP Location: Left Arm)   Pulse 72   Temp 97.7 F (36.5 C) (Oral)   Resp 16   SpO2 99%   Visual Acuity Right Eye Distance:   Left Eye Distance:   Bilateral Distance:    Right Eye Near:   Left Eye Near:    Bilateral Near:     Physical Exam Vitals and nursing note reviewed.  Constitutional:      Appearance: She is well-developed.     Comments: No acute distress  HENT:     Head: Normocephalic and atraumatic.     Nose: Nose normal.  Eyes:     Conjunctiva/sclera: Conjunctivae normal.  Cardiovascular:     Rate and Rhythm: Normal rate.  Pulmonary:     Effort: Pulmonary effort is normal. No respiratory distress.  Abdominal:     General: There is no distension.  Musculoskeletal:        General: Normal range of motion.     Cervical back: Neck supple.     Comments: Left ankle: Nontender to palpation of medial and lateral malleolus, tender to palpation to lower Achilles, feels firm and intact, no defects noted, nontender to inferior/insertional area, negative Thompson's  Skin:    General: Skin is  warm and dry.  Neurological:     Mental Status: She  is alert and oriented to person, place, and time.      UC Treatments / Results  Labs (all labs ordered are listed, but only abnormal results are displayed) Labs Reviewed - No data to display  EKG   Radiology No results found.  Procedures Procedures (including critical care time)  Medications Ordered in UC Medications - No data to display  Initial Impression / Assessment and Plan / UC Course  I have reviewed the triage vital signs and the nursing notes.  Pertinent labs & imaging results that were available during my care of the patient were reviewed by me and considered in my medical decision making (see chart for details).     Achilles tendinitis-continue NSAIDs ice elevation and gentle stretches at home, gradually ease back into activity.  Ace wrap provided.  Discussed strict return precautions. Patient verbalized understanding and is agreeable with plan.  Final Clinical Impressions(s) / UC Diagnoses   Final diagnoses:  Achilles tendinitis, left leg     Discharge Instructions     Ice and elevate Achilles after activity Naprosyn twice daily with food for the next week May use Ace wrap for comfort Gradually ease into activity Please try Achilles stretches attached and back twice daily  Follow-up if not improving or worsening    ED Prescriptions    Medication Sig Dispense Auth. Provider   naproxen (NAPROSYN) 500 MG tablet Take 1 tablet (500 mg total) by mouth 2 (two) times daily. 30 tablet Paetyn Pietrzak, [place] C, PA-C     PDMP not reviewed this encounter.   Candler Ginsberg, [place] C, PA-C 03/20/21 1254

## 2022-05-26 ENCOUNTER — Inpatient Hospital Stay (EMERGENCY_DEPARTMENT_HOSPITAL)
Admission: AD | Admit: 2022-05-26 | Discharge: 2022-05-26 | Disposition: A | Discharge status: Home / Self Care | Source: Home / Self Care | Admitting: Obstetrics and Gynecology

## 2022-05-26 ENCOUNTER — Encounter (HOSPITAL_COMMUNITY): Payer: Self-pay | Admitting: Obstetrics and Gynecology

## 2022-05-26 ENCOUNTER — Inpatient Hospital Stay (HOSPITAL_COMMUNITY)

## 2022-05-26 ENCOUNTER — Other Ambulatory Visit: Payer: Self-pay

## 2022-05-26 DIAGNOSIS — O99351 Diseases of the nervous system complicating pregnancy, first trimester: Secondary | ICD-10-CM | POA: Insufficient documentation

## 2022-05-26 DIAGNOSIS — A419 Sepsis, unspecified organism: Secondary | ICD-10-CM | POA: Diagnosis not present

## 2022-05-26 DIAGNOSIS — Z3A13 13 weeks gestation of pregnancy: Secondary | ICD-10-CM | POA: Insufficient documentation

## 2022-05-26 DIAGNOSIS — Z20822 Contact with and (suspected) exposure to covid-19: Secondary | ICD-10-CM | POA: Insufficient documentation

## 2022-05-26 DIAGNOSIS — Z6831 Body mass index (BMI) 31.0-31.9, adult: Secondary | ICD-10-CM | POA: Diagnosis not present

## 2022-05-26 DIAGNOSIS — O039 Complete or unspecified spontaneous abortion without complication: Secondary | ICD-10-CM

## 2022-05-26 DIAGNOSIS — O0387 Sepsis following complete or unspecified spontaneous abortion: Secondary | ICD-10-CM | POA: Diagnosis not present

## 2022-05-26 DIAGNOSIS — E669 Obesity, unspecified: Secondary | ICD-10-CM | POA: Diagnosis not present

## 2022-05-26 DIAGNOSIS — G43909 Migraine, unspecified, not intractable, without status migrainosus: Secondary | ICD-10-CM | POA: Insufficient documentation

## 2022-05-26 DIAGNOSIS — Z880 Allergy status to penicillin: Secondary | ICD-10-CM | POA: Diagnosis not present

## 2022-05-26 DIAGNOSIS — R1030 Lower abdominal pain, unspecified: Secondary | ICD-10-CM | POA: Insufficient documentation

## 2022-05-26 DIAGNOSIS — R509 Fever, unspecified: Secondary | ICD-10-CM | POA: Insufficient documentation

## 2022-05-26 DIAGNOSIS — O26891 Other specified pregnancy related conditions, first trimester: Secondary | ICD-10-CM | POA: Insufficient documentation

## 2022-05-26 DIAGNOSIS — O209 Hemorrhage in early pregnancy, unspecified: Secondary | ICD-10-CM | POA: Diagnosis present

## 2022-05-26 DIAGNOSIS — O3680X0 Pregnancy with inconclusive fetal viability, not applicable or unspecified: Secondary | ICD-10-CM

## 2022-05-26 LAB — URINALYSIS, ROUTINE W REFLEX MICROSCOPIC
Bilirubin Urine: NEGATIVE
Glucose, UA: NEGATIVE mg/dL
Ketones, ur: NEGATIVE mg/dL
Nitrite: NEGATIVE
Protein, ur: NEGATIVE mg/dL
RBC / HPF: >50 RBC/hpf — ABNORMAL HIGH (ref 0–5)
Specific Gravity, Urine: 1.013 (ref 1.005–1.030)
WBC, UA: >50 WBC/hpf — ABNORMAL HIGH (ref 0–5)
pH: 6 (ref 5.0–8.0)

## 2022-05-26 LAB — CBC
HCT: 26.2 % — ABNORMAL LOW (ref 36.0–46.0)
Hemoglobin: 9.3 g/dL — ABNORMAL LOW (ref 12.0–15.0)
MCH: 30 pg (ref 26.0–34.0)
MCHC: 35.5 g/dL (ref 30.0–36.0)
MCV: 84.5 fL (ref 80.0–100.0)
Platelets: 261 10*3/uL (ref 150–400)
RBC: 3.1 MIL/uL — ABNORMAL LOW (ref 3.87–5.11)
RDW: 13 % (ref 11.5–15.5)
WBC: 13.1 10*3/uL — ABNORMAL HIGH (ref 4.0–10.5)
nRBC: 0 % (ref 0.0–0.2)

## 2022-05-26 LAB — COMPREHENSIVE METABOLIC PANEL
ALT: 29 U/L (ref 0–44)
AST: 21 U/L (ref 15–41)
Albumin: 3.1 g/dL — ABNORMAL LOW (ref 3.5–5.0)
Alkaline Phosphatase: 78 U/L (ref 38–126)
Anion gap: 6 (ref 5–15)
BUN: 5 mg/dL — ABNORMAL LOW (ref 6–20)
CO2: 25 mmol/L (ref 22–32)
Calcium: 8.6 mg/dL — ABNORMAL LOW (ref 8.9–10.3)
Chloride: 105 mmol/L (ref 98–111)
Creatinine, Ser: 0.7 mg/dL (ref 0.44–1.00)
GFR, Estimated: >60 mL/min (ref >60)
Glucose, Bld: 90 mg/dL (ref 70–99)
Potassium: 4 mmol/L (ref 3.5–5.1)
Sodium: 136 mmol/L (ref 135–145)
Total Bilirubin: 0.6 mg/dL (ref 0.3–1.2)
Total Protein: 6.5 g/dL (ref 6.5–8.1)

## 2022-05-26 LAB — POCT PREGNANCY, URINE: Preg Test, Ur: POSITIVE — AB

## 2022-05-26 LAB — TYPE AND SCREEN
ABO/RH(D): O POS
Antibody Screen: NEGATIVE

## 2022-05-26 LAB — HCG, QUANTITATIVE, PREGNANCY: hCG, Beta Chain, Quant, S: 7845 m[IU]/mL — ABNORMAL HIGH (ref <5)

## 2022-05-26 LAB — ABO/RH: ABO/RH(D): O POS

## 2022-05-26 LAB — WET PREP, GENITAL
Clue Cells Wet Prep HPF POC: NONE SEEN
Sperm: NONE SEEN
Trich, Wet Prep: NONE SEEN
WBC, Wet Prep HPF POC: >=10 — AB (ref <10)
Yeast Wet Prep HPF POC: NONE SEEN

## 2022-05-26 LAB — RESP PANEL BY RT-PCR (FLU A&B, COVID) ARPGX2
Influenza A by PCR: NEGATIVE
Influenza B by PCR: NEGATIVE
SARS Coronavirus 2 by RT PCR: NEGATIVE

## 2022-05-26 MED ORDER — DIPHENHYDRAMINE HCL 50 MG/ML IJ SOLN
12.5000 mg | Freq: Once | INTRAMUSCULAR | Status: AC
  Administered 2022-05-26: 12.5 mg via INTRAVENOUS
  Filled 2022-05-26: qty 1

## 2022-05-26 MED ORDER — ACETAMINOPHEN 500 MG PO TABS
1000.0000 mg | ORAL_TABLET | Freq: Once | ORAL | Status: AC
  Administered 2022-05-26: 1000 mg via ORAL
  Filled 2022-05-26: qty 2

## 2022-05-26 MED ORDER — LACTATED RINGERS IV BOLUS
1000.0000 mL | Freq: Once | INTRAVENOUS | Status: AC
  Administered 2022-05-26: 1000 mL via INTRAVENOUS

## 2022-05-26 MED ORDER — HYDROMORPHONE HCL 1 MG/ML IJ SOLN
0.5000 mg | INTRAMUSCULAR | Status: DC | PRN
  Administered 2022-05-26: 0.5 mg via INTRAVENOUS
  Filled 2022-05-26: qty 1

## 2022-05-26 MED ORDER — METOCLOPRAMIDE HCL 5 MG/ML IJ SOLN
5.0000 mg | Freq: Once | INTRAMUSCULAR | Status: AC
  Administered 2022-05-26: 5 mg via INTRAVENOUS
  Filled 2022-05-26: qty 2

## 2022-05-26 MED ORDER — OXYCODONE HCL 5 MG PO CAPS: 5.0000 mg | ORAL_CAPSULE | Freq: Four times a day (QID) | ORAL | 0 refills | Status: AC | PRN

## 2022-05-26 NOTE — MAU Note (Signed)
.  Christy [name] is a 36 y.o. at Unknown here in MAU reporting: had positive HPT 4 days ago .Pt c/o vaginal bleeding/ cramping and passing clots c/o headache/migraine / Chills.  LMP: 02/21/22 (irregular) Onset of complaint: 4 days Pain score: 10 Vitals:   05/26/22 1340  BP: (!) 136/58  Pulse: (!) 108  Resp: 18  Temp: (!) 101.1 F (38.4 C)     FHT: Lab orders placed from triage:   UPT, U/A

## 2022-05-26 NOTE — MAU Provider Note (Signed)
History     CSN: [id number]  Arrival date and time: 05/26/22 1155   Event Date/Time   First Provider Initiated Contact with Patient 05/26/22 1403      Chief Complaint  Patient presents with   Vaginal Bleeding   Christy [name], a  36 y.o. [phone] at [redacted]w[redacted]d presents to MAU with complaints of vaginal bleeding, abdominal pain and a migraine headache since Tuesday.  Patient states that she started bleeding on Tuesday of this week. She states that she has been going through 3-4 pads per day. Patient states she attempted to relieve pain with a bath, but the water became "very dark with blood" and she passed a clot "the size of a grapefruit." Denies large clots since then "down to quarter size".  Denies urinary symptoms.    - She also complains of intermittent lower abdominal pain currently rating pain a 7/10. Took ibuprofen this morning for pain without relief.   - C/o migraine. Onset Tuesday. Currently rating pain 6/10. Worsened by light, not by sound. Denies blurred vision.   Upon arrival to MAU patient has a fever of 101.18F. Endorses chills, but denies other cold/flu-like symptoms  Husband at bedside contriubuting to HPI.     OB History     Gravida  5   Para  3   Term  2   Preterm  1   AB  1   Living  3      SAB  1   IAB      Ectopic      Multiple  0   Live Births  1           Past Medical History:  Diagnosis Date   Anemia    Diarrhea during pregnancy 07/16/2018   Nausea and vomiting of pregnancy, antepartum 03/04/2018   Preterm labor    Sickle cell trait [facility])    Supervision of other normal pregnancy, antepartum 02/25/2018    Nursing Staff Provider Office Location  [facility]  Dating  U/S Language  English Anatomy US  02-27-18 Flu Vaccine   Genetic Screen  NIPS:   AFP:   First Screen:  Quad:   TDaP vaccine   05-27-18 Hgb A1C or  GTT Early  Third trimester  Rhogam     LAB RESULTS  Feeding Plan Breast  Blood Type --/--/O POS, O POS Performed at [facility], [address] [place], [place] [address]  [phone] 1957)  Contra   Vaginal Pap smear, abnormal     Past Surgical History:  Procedure Laterality Date   CESAREAN SECTION     FIBULA FRACTURE SURGERY     TIBIA FRACTURE SURGERY     VULVA SURGERY     Hematoma   WISDOM TOOTH EXTRACTION      Family History  Problem Relation Age of Onset   Diabetes Mother    Diabetes Brother    Diabetes Maternal Aunt    Kidney disease Maternal Aunt    Diabetes Paternal Grandmother    Dementia Paternal Grandmother     Social History   Tobacco Use   Smoking status: Never   Smokeless tobacco: Never  Vaping Use   Vaping Use: Never used  Substance Use Topics   Alcohol use: Never   Drug use: Never    Allergies:  Allergies  Allergen Reactions   Amoxicillin Hives   Penicillins Hives and Nausea And Vomiting    Has patient had a PCN reaction causing immediate rash, facial/tongue/throat swelling, SOB or  lightheadedness with hypotension: Yes, no anaphylaxis reaction.  Has patient had a PCN reaction causing severe rash involving mucus membranes or skin necrosis: Yes Has patient had a PCN reaction that required hospitalization: No Has patient had a PCN reaction occurring within the last 10 years: Yes If all of the above answers are "NO", then may proceed with Cephalosporin use.     No medications prior to admission.    Review of Systems  Constitutional:  Positive for chills and fatigue. Negative for fever.  HENT:  Negative for congestion, sneezing and sore throat.   Respiratory:  Negative for apnea, chest tightness, shortness of breath and wheezing.   Cardiovascular:  Negative for chest pain.  Gastrointestinal:  Positive for abdominal pain. Negative for diarrhea and nausea.  Genitourinary:  Positive for pelvic pain, vaginal bleeding and vaginal pain. Negative for difficulty urinating, flank pain and vaginal discharge.  Musculoskeletal:  Negative for back pain.  Neurological:  Negative  for dizziness, light-headedness and headaches.   Physical Exam   Blood pressure 119/71, pulse 86, temperature 98.6 F (37 C), temperature source Oral, resp. rate 18, height 5\' 1"  (1.549 m), weight 76.9 kg, last menstrual period 02/21/2022, SpO2 99 %, unknown if currently breastfeeding.  Physical Exam Vitals and nursing note reviewed. Exam conducted with a chaperone present.  Constitutional:      Appearance: She is ill-appearing.  HENT:     Head: Normocephalic.  Cardiovascular:     Rate and Rhythm: Normal rate.  Pulmonary:     Effort: Pulmonary effort is normal.     Breath sounds: Normal breath sounds.  Abdominal:     Palpations: Abdomen is soft.     Tenderness: There is abdominal tenderness in the right lower quadrant. There is guarding.  Genitourinary:    General: Normal vulva.     Vagina: Tenderness present.     Cervix: Cervical bleeding present.     Comments: Red/Brown vaginal bleeding noted on exam.  Musculoskeletal:        General: Normal range of motion.     Cervical back: Normal range of motion.  Skin:    General: Skin is warm and dry.  Neurological:     Mental Status: She is alert and oriented to person, place, and time.  Psychiatric:        Mood and Affect: Mood normal.    Patient Vitals for the past 24 hrs:  BP Temp Temp src Pulse Resp SpO2 Height Weight  05/26/22 1656 119/71 98.6 F (37 C) Oral 86 -- 99 % -- --  05/26/22 1600 -- 99.9 F (37.7 C) Oral -- -- -- -- --  05/26/22 1401 129/76 (!) 101.3 F (38.5 C) Oral (!) 105 -- -- -- --  05/26/22 1340 (!) 136/58 (!) 101.1 F (38.4 C) -- (!) 108 18 -- 5\' 1"  (1.549 m) 76.9 kg     MAU Course  Procedures Lab Orders         Wet prep, genital         Resp Panel by RT-PCR (Flu A&B, Covid) Anterior Nasal Swab         Culture, OB Urine         Urinalysis, Routine w reflex microscopic Urine, Clean Catch         CBC         hCG, quantitative, pregnancy         Comprehensive metabolic panel         Pregnancy,  urine POC  PRN Meds:.HYDROmorphone LR Bolus and TVUS ordered.   Results for orders placed or performed during the hospital encounter of 05/26/22 (from the past 24 hour(s))  Pregnancy, urine POC     Status: Abnormal   Collection Time: 05/26/22  1:35 PM  Result Value Ref Range   Preg Test, Ur POSITIVE (A) NEGATIVE  Urinalysis, Routine w reflex microscopic Urine, Clean Catch     Status: Abnormal   Collection Time: 05/26/22  1:38 PM  Result Value Ref Range   Color, Urine YELLOW YELLOW   APPearance HAZY (A) CLEAR   Specific Gravity, Urine 1.013 1.005 - 1.030   pH 6.0 5.0 - 8.0   Glucose, UA NEGATIVE NEGATIVE mg/dL   Hgb urine dipstick LARGE (A) NEGATIVE   Bilirubin Urine NEGATIVE NEGATIVE   Ketones, ur NEGATIVE NEGATIVE mg/dL   Protein, ur NEGATIVE NEGATIVE mg/dL   Nitrite NEGATIVE NEGATIVE   Leukocytes,Ua LARGE (A) NEGATIVE   RBC / HPF >50 (H) 0 - 5 RBC/hpf   WBC, UA >50 (H) 0 - 5 WBC/hpf   Bacteria, UA FEW (A) NONE SEEN   Squamous Epithelial / LPF 0-5 0 - 5   WBC Clumps PRESENT   CBC     Status: Abnormal   Collection Time: 05/26/22  2:12 PM  Result Value Ref Range   WBC 13.1 (H) 4.0 - 10.5 K/uL   RBC 3.10 (L) 3.87 - 5.11 MIL/uL   Hemoglobin 9.3 (L) 12.0 - 15.0 g/dL   HCT 42.5 (L) 95.6 - 38.7 %   MCV 84.5 80.0 - 100.0 fL   MCH 30.0 26.0 - 34.0 pg   MCHC 35.5 30.0 - 36.0 g/dL   RDW 56.4 33.2 - 95.1 %   Platelets 261 150 - 400 K/uL   nRBC 0.0 0.0 - 0.2 %  ABO/Rh     Status: None   Collection Time: 05/26/22  2:12 PM  Result Value Ref Range   ABO/RH(D)      O POS Performed at [facility], [address] [place], [place] [address]   hCG, quantitative, pregnancy     Status: Abnormal   Collection Time: 05/26/22  2:12 PM  Result Value Ref Range   hCG, Beta Chain, Quant, S 7,845 (H) <5 mIU/mL  Wet prep, genital     Status: Abnormal   Collection Time: 05/26/22  2:20 PM   Specimen: PATH Cytology Cervicovaginal Ancillary Only  Result Value Ref Range   Yeast Wet Prep  HPF POC NONE SEEN NONE SEEN   Trich, Wet Prep NONE SEEN NONE SEEN   Clue Cells Wet Prep HPF POC NONE SEEN NONE SEEN   WBC, Wet Prep HPF POC >=10 (A) <10   Sperm NONE SEEN   Resp Panel by RT-PCR (Flu A&B, Covid) Anterior Nasal Swab     Status: None   Collection Time: 05/26/22  2:20 PM   Specimen: Anterior Nasal Swab  Result Value Ref Range   SARS Coronavirus 2 by RT PCR NEGATIVE NEGATIVE   Influenza A by PCR NEGATIVE NEGATIVE   Influenza B by PCR NEGATIVE NEGATIVE  Type and screen [facility]     Status: None   Collection Time: 05/26/22  3:03 PM  Result Value Ref Range   ABO/RH(D) O POS    Antibody Screen NEG    Sample Expiration      05/29/2022,2359 Performed at [facility], [address] [place], [place] [address]   Comprehensive metabolic panel  Status: Abnormal   Collection Time: 05/26/22  3:03 PM  Result Value Ref Range   Sodium 136 135 - 145 mmol/L   Potassium 4.0 3.5 - 5.1 mmol/L   Chloride 105 98 - 111 mmol/L   CO2 25 22 - 32 mmol/L   Glucose, Bld 90 70 - 99 mg/dL   BUN 5 (L) 6 - 20 mg/dL   Creatinine, Ser 9.39 0.44 - 1.00 mg/dL   Calcium 8.6 (L) 8.9 - 10.3 mg/dL   Total Protein 6.5 6.5 - 8.1 g/dL   Albumin 3.1 (L) 3.5 - 5.0 g/dL   AST 21 15 - 41 U/L   ALT 29 0 - 44 U/L   Alkaline Phosphatase 78 38 - 126 U/L   Total Bilirubin 0.6 0.3 - 1.2 mg/dL   GFR, Estimated >03 >00 mL/min   Anion gap 6 5 - 15   US OB LESS THAN 14 WEEKS WITH OB TRANSVAGINAL  Result Date: 05/26/2022 CLINICAL DATA:  Vaginal bleeding EXAM: OBSTETRIC <14 WK [place] AND TRANSVAGINAL OB US TECHNIQUE: Both transabdominal and transvaginal ultrasound examinations were performed for complete evaluation of the gestation as well as the maternal uterus, adnexal regions, and pelvic cul-de-sac. Transvaginal technique was performed to assess early pregnancy. COMPARISON:  None Available. FINDINGS: Intrauterine gestational sac: None Yolk sac:  Not Visualized. Embryo:  Not Visualized.  Cardiac Activity: Not Visualized. Subchorionic hemorrhage:  None visualized. Maternal uterus/adnexae: Thickened and heterogeneous endometrium. Normal appearing left ovary. Right ovary is not visualized. No free fluid seen in the pelvis. IMPRESSION: 1. No intrauterine gestational sac, yolk sac, or fetal pole identified. In the setting of positive pregnancy test and no definite intrauterine pregnancy, this reflects a pregnancy of unknown location. Differential considerations include early normal IUP, abnormal IUP, or nonvisualized ectopic pregnancy. Differentiation is achieved with serial beta HCG supplemented by repeat sonography as clinically warranted. 2. Thickened and heterogeneous endometrium, possibly due to blood products. Electronically Signed   By: Allegra Lai M.D.   On: 05/26/2022 15:40    MDM Urine sent for Culture.  Pain from Migraine and Abdominal pain improved with meds.   Temperature decreased from 101.3 down to 98.0 F with Tylenol.  No IUP noted on ultrasound. Quant >7000 Dx: pregnancy of unknown location.   Consulted Dr. Alysia Penna. Discussed patient presentation, lab work, and improvement during MAU. Recommendation to provide strict bleeding and return precautions, and follow up for Serial quant in 48 hours.    Assessment and Plan   1. Pregnancy of unknown anatomic location   2. Spontaneous miscarriage   3. [redacted] weeks gestation of pregnancy   4. Fever, unspecified fever cause    - Discussed that this is currently a pregnancy of unknown location. Discussed 3 options of current diagnosis, 1 complete miscarriage, 2. Early IUP, 3. or non-visualized ectopic pregnancy. Discussed that with the amount of bleeding and findings of the Ultrasound, high likelihood of miscarriage.  - Appointment scheduled for repeat Quant in 48 hours in MAU on 05/28/22.  - Strict bleeding, temperature and return precautions discussed with patient and FOB.  - Recommended  PO Tylenol as needed for fever.  - Patient  discharged home in stable condition and may return to MAU as needed.   Claudette Head, MSN CNM  05/26/2022, 9:28 PM

## 2022-05-28 ENCOUNTER — Other Ambulatory Visit (HOSPITAL_COMMUNITY)
Admit: 2022-05-28 | Discharge: 2022-05-28 | Disposition: A | Discharge status: Home / Self Care | Source: Home / Self Care | Attending: Obstetrics and Gynecology | Admitting: Obstetrics and Gynecology

## 2022-05-28 ENCOUNTER — Inpatient Hospital Stay (HOSPITAL_COMMUNITY)

## 2022-05-28 ENCOUNTER — Encounter (HOSPITAL_COMMUNITY): Payer: Self-pay | Admitting: Obstetrics & Gynecology

## 2022-05-28 ENCOUNTER — Encounter (HOSPITAL_COMMUNITY)
Admission: AD | Disposition: A | Discharge status: Home / Self Care | Payer: Self-pay | Source: Home / Self Care | Attending: Obstetrics and Gynecology

## 2022-05-28 ENCOUNTER — Inpatient Hospital Stay (HOSPITAL_COMMUNITY)
Admission: AD | Admit: 2022-05-28 | Discharge: 2022-05-30 | DRG: 770 | Disposition: A | Discharge status: Home / Self Care | Attending: Obstetrics and Gynecology | Admitting: Obstetrics and Gynecology

## 2022-05-28 DIAGNOSIS — E669 Obesity, unspecified: Secondary | ICD-10-CM | POA: Diagnosis present

## 2022-05-28 DIAGNOSIS — Z880 Allergy status to penicillin: Secondary | ICD-10-CM

## 2022-05-28 DIAGNOSIS — Z20822 Contact with and (suspected) exposure to covid-19: Secondary | ICD-10-CM | POA: Diagnosis present

## 2022-05-28 DIAGNOSIS — A419 Sepsis, unspecified organism: Secondary | ICD-10-CM | POA: Diagnosis present

## 2022-05-28 DIAGNOSIS — R102 Pelvic and perineal pain: Secondary | ICD-10-CM | POA: Diagnosis present

## 2022-05-28 DIAGNOSIS — O209 Hemorrhage in early pregnancy, unspecified: Secondary | ICD-10-CM | POA: Diagnosis present

## 2022-05-28 DIAGNOSIS — Z6831 Body mass index (BMI) 31.0-31.9, adult: Secondary | ICD-10-CM

## 2022-05-28 DIAGNOSIS — O0387 Sepsis following complete or unspecified spontaneous abortion: Secondary | ICD-10-CM | POA: Diagnosis not present

## 2022-05-28 HISTORY — PX: DILATION AND CURETTAGE OF UTERUS: SHX78

## 2022-05-28 LAB — CBC WITH DIFFERENTIAL/PLATELET
Abs Immature Granulocytes: 0.06 K/uL (ref 0.00–0.07)
Basophils Absolute: 0 K/uL (ref 0.0–0.1)
Basophils Relative: 0 %
Eosinophils Absolute: 0.1 K/uL (ref 0.0–0.5)
Eosinophils Relative: 1 %
HCT: 24.8 % — ABNORMAL LOW (ref 36.0–46.0)
Hemoglobin: 8.7 g/dL — ABNORMAL LOW (ref 12.0–15.0)
Immature Granulocytes: 1 %
Lymphocytes Relative: 9 %
Lymphs Abs: 1 K/uL (ref 0.7–4.0)
MCH: 29.7 pg (ref 26.0–34.0)
MCHC: 35.1 g/dL (ref 30.0–36.0)
MCV: 84.6 fL (ref 80.0–100.0)
Monocytes Absolute: 1.1 K/uL — ABNORMAL HIGH (ref 0.1–1.0)
Monocytes Relative: 9 %
Neutro Abs: 9.5 K/uL — ABNORMAL HIGH (ref 1.7–7.7)
Neutrophils Relative %: 80 %
Platelets: 277 K/uL (ref 150–400)
RBC: 2.93 MIL/uL — ABNORMAL LOW (ref 3.87–5.11)
RDW: 12.9 % (ref 11.5–15.5)
WBC: 11.8 K/uL — ABNORMAL HIGH (ref 4.0–10.5)
nRBC: 0 % (ref 0.0–0.2)

## 2022-05-28 LAB — COMPREHENSIVE METABOLIC PANEL
ALT: 24 U/L (ref 0–44)
AST: 19 U/L (ref 15–41)
Albumin: 3 g/dL — ABNORMAL LOW (ref 3.5–5.0)
Alkaline Phosphatase: 73 U/L (ref 38–126)
Anion gap: 7 (ref 5–15)
BUN: <5 mg/dL — ABNORMAL LOW (ref 6–20)
CO2: 24 mmol/L (ref 22–32)
Calcium: 8.8 mg/dL — ABNORMAL LOW (ref 8.9–10.3)
Chloride: 107 mmol/L (ref 98–111)
Creatinine, Ser: 0.68 mg/dL (ref 0.44–1.00)
GFR, Estimated: >60 mL/min (ref >60)
Glucose, Bld: 94 mg/dL (ref 70–99)
Potassium: 3.9 mmol/L (ref 3.5–5.1)
Sodium: 138 mmol/L (ref 135–145)
Total Bilirubin: 0.5 mg/dL (ref 0.3–1.2)
Total Protein: 6.5 g/dL (ref 6.5–8.1)

## 2022-05-28 LAB — TYPE AND SCREEN
ABO/RH(D): O POS
Antibody Screen: NEGATIVE

## 2022-05-28 LAB — HCG, QUANTITATIVE, PREGNANCY: hCG, Beta Chain, Quant, S: 4712 m[IU]/mL — ABNORMAL HIGH

## 2022-05-28 SURGERY — DILATION AND CURETTAGE
Anesthesia: General

## 2022-05-28 MED ORDER — HYDROMORPHONE HCL 1 MG/ML IJ SOLN
1.0000 mg | INTRAMUSCULAR | Status: DC | PRN
  Administered 2022-05-28: 1 mg via INTRAVENOUS
  Filled 2022-05-28: qty 1

## 2022-05-28 MED ORDER — VANCOMYCIN HCL IN DEXTROSE 1-5 GM/200ML-% IV SOLN
1000.0000 mg | Freq: Once | INTRAVENOUS | Status: AC
Start: 2022-05-29 — End: 2022-05-29
  Administered 2022-05-29: 1000 mg via INTRAVENOUS
  Filled 2022-05-28: qty 200

## 2022-05-28 MED ORDER — GENTAMICIN SULFATE 40 MG/ML IJ SOLN
5.0000 mg/kg | Freq: Once | INTRAVENOUS | Status: AC
  Administered 2022-05-28: 380 mg via INTRAVENOUS
  Filled 2022-05-28: qty 9.5

## 2022-05-28 MED ORDER — POVIDONE-IODINE 10 % EX SWAB: 2.0000 | Freq: Once | CUTANEOUS | Status: DC

## 2022-05-28 MED ORDER — LACTATED RINGERS IV BOLUS
1000.0000 mL | Freq: Once | INTRAVENOUS | Status: AC
  Administered 2022-05-28: 1000 mL via INTRAVENOUS

## 2022-05-28 MED ORDER — LACTATED RINGERS IV BOLUS (SEPSIS): 500.0000 mL | Freq: Once | INTRAVENOUS | Status: DC

## 2022-05-28 MED ORDER — LACTATED RINGERS IV SOLN: INTRAVENOUS | Status: DC

## 2022-05-28 MED ORDER — MIDAZOLAM HCL 2 MG/2ML IJ SOLN
INTRAMUSCULAR | Status: AC
  Filled 2022-05-28: qty 2

## 2022-05-28 MED ORDER — FENTANYL CITRATE (PF) 250 MCG/5ML IJ SOLN
INTRAMUSCULAR | Status: AC
  Filled 2022-05-28: qty 5

## 2022-05-28 MED ORDER — METRONIDAZOLE 500 MG/100ML IV SOLN
500.0000 mg | Freq: Once | INTRAVENOUS | Status: AC
  Administered 2022-05-29: 500 mg via INTRAVENOUS
  Filled 2022-05-28: qty 100

## 2022-05-28 MED ORDER — LACTATED RINGERS IV BOLUS (SEPSIS)
1000.0000 mL | Freq: Once | INTRAVENOUS | Status: AC
  Administered 2022-05-28: 1000 mL via INTRAVENOUS

## 2022-05-28 MED ORDER — LACTATED RINGERS IV BOLUS (SEPSIS): 1000.0000 mL | Freq: Once | INTRAVENOUS | Status: DC

## 2022-05-28 MED ORDER — ACETAMINOPHEN 500 MG PO TABS
1000.0000 mg | ORAL_TABLET | Freq: Once | ORAL | Status: AC
  Administered 2022-05-28: 1000 mg via ORAL
  Filled 2022-05-28: qty 2

## 2022-05-28 SURGICAL SUPPLY — 20 items
CATH ROBINSON RED A/P 16FR (CATHETERS) ×2 IMPLANT
CNTNR URN SCR LID CUP LEK RST (MISCELLANEOUS) ×1 IMPLANT
CONT SPEC 4OZ STRL OR WHT (MISCELLANEOUS) ×2
GLOVE BIO SURGEON STRL SZ 6.5 (GLOVE) ×2 IMPLANT
GLOVE BIOGEL PI IND STRL 7.0 (GLOVE) ×2 IMPLANT
GLOVE BIOGEL PI INDICATOR 7.0 (GLOVE) ×2
GOWN STRL REUS W/ TWL LRG LVL3 (GOWN DISPOSABLE) ×2 IMPLANT
GOWN STRL REUS W/TWL LRG LVL3 (GOWN DISPOSABLE) ×4
KIT BERKELEY 1ST TRIMESTER 3/8 (MISCELLANEOUS) ×2 IMPLANT
NS IRRIG 1000ML POUR BTL (IV SOLUTION) ×2 IMPLANT
PACK VAGINAL MINOR WOMEN LF (CUSTOM PROCEDURE TRAY) ×2 IMPLANT
PAD OB MATERNITY 4.3X12.25 (PERSONAL CARE ITEMS) ×2 IMPLANT
SET BERKELEY SUCTION TUBING (SUCTIONS) ×2 IMPLANT
SPIKE FLUID TRANSFER (MISCELLANEOUS) ×2 IMPLANT
TOWEL GREEN STERILE FF (TOWEL DISPOSABLE) ×4 IMPLANT
UNDERPAD 30X36 HEAVY ABSORB (UNDERPADS AND DIAPERS) ×2 IMPLANT
VACURETTE 10 RIGID CVD (CANNULA) ×1 IMPLANT
VACURETTE 7MM CVD STRL WRAP (CANNULA) ×1 IMPLANT
VACURETTE 8 RIGID CVD (CANNULA) IMPLANT
VACURETTE 9 RIGID CVD (CANNULA) IMPLANT

## 2022-05-28 NOTE — MAU Note (Signed)
.  Christy [name] is a 36 y.o. at [redacted]w[redacted]d here in MAU reporting: that she is her for a follow up quant level. Patient informed RN upon entry into triage that she was seeing spots in her vision and that she has a HA (10/10). Took Tylenol around 1800. She is still having the dull pelvic pain and cramping (3/10). She is still having red spotting that she says has not changed from the last time she was here.  Onset of complaint: on-going Pain score: 10/10 Vitals:   05/28/22 2055  BP: 124/60  Pulse: (!) 108  Resp: 18  Temp: (!) 102.3 F (39.1 C)  SpO2: 99%      Lab orders placed from triage:  UA

## 2022-05-28 NOTE — H&P (Addendum)
FACULTY PRACTICE ANTEPARTUM ADMISSION HISTORY AND PHYSICAL NOTE   History of Present Illness: Christy [name] is a 36 y.o. [phone] at [redacted]w[redacted]d who presented with worsening pelvic pain and continued bleeding.  Pt initially presented on Friday with complaints of abdominal pain, vaginal bleeding, fever and headache.  She had showed some improvement with tylenol; however her symptoms have not completely resolved.  She continues to experience intense lower abdominal pain and vaginal bleeding. Initially the bleeding was heavy and notes she went through an entire box of pad changing every few hours.  Bleeding has improved somewhat, but still present changing a pad every 4-5 hours.  Pain medication including tylenol and oxycodone does help when taken.  Significant fever and chills at home.  Denies nausea/vomiting. Notes decreased appetite.  PMHx:  sickle cell trait  Past Surgical History:  Procedure Laterality Date   CESAREAN SECTION     FIBULA FRACTURE SURGERY     TIBIA FRACTURE SURGERY     VULVA SURGERY     Hematoma   WISDOM TOOTH EXTRACTION      OB History  Gravida Para Term Preterm AB Living  5 3 2 1 1 3   SAB IAB Ectopic Multiple Live Births  1     0 1    # Outcome Date GA Lbr Len/2nd Weight Sex Delivery Anes PTL Lv  5 Current           4 Term 07/24/18 [redacted]w[redacted]d / 00:11 2384 g M VBAC EPI  LIV     Birth Comments: WNL  3 Preterm 11/26/15     CS-Unspec     2 Term 10/24/06     Vag-Spont     1 SAB             Social History   Socioeconomic History   Marital status: Married    Spouse name: Not on file   Number of children: Not on file   Years of education: Not on file   Highest education level: Not on file  Occupational History   Not on file  Tobacco Use   Smoking status: Never   Smokeless tobacco: Never  Vaping Use   Vaping Use: Never used  Substance and Sexual Activity   Alcohol use: Never   Drug use: Never   Sexual activity: Not Currently    Birth control/protection:  None  Other Topics Concern   Not on file  Social History Narrative   Not on file   Social Determinants of Health   Financial Resource Strain: Not on file  Food Insecurity: Not on file  Transportation Needs: Not on file  Physical Activity: Not on file  Stress: Not on file  Social Connections: Not on file    Family History  Problem Relation Age of Onset   Diabetes Mother    Diabetes Brother    Diabetes Maternal Aunt    Kidney disease Maternal Aunt    Diabetes Paternal Grandmother    Dementia Paternal Grandmother     Allergies  Allergen Reactions   Amoxicillin Hives   Penicillins Hives and Nausea And Vomiting    Has patient had a PCN reaction causing immediate rash, facial/tongue/throat swelling, SOB or lightheadedness with hypotension: Yes, no anaphylaxis reaction.  Has patient had a PCN reaction causing severe rash involving mucus membranes or skin necrosis: Yes Has patient had a PCN reaction that required hospitalization: No Has patient had a PCN reaction occurring within the last 10 years: Yes If all of the above answers  are "NO", then may proceed with Cephalosporin use.     Medications Prior to Admission  Medication Sig Dispense Refill Last Dose   acetaminophen (TYLENOL) 325 MG tablet Take 650 mg by mouth every 6 (six) hours as needed for mild pain or headache.   05/28/2022   oxycodone (OXY-IR) 5 MG capsule Take 1 capsule (5 mg total) by mouth every 6 (six) hours as needed for up to 7 days (breakthrough pain). 10 capsule 0 05/28/2022   Prenatal Vit-Fe Fumarate-FA (PRENATAL MULTIVITAMIN) TABS tablet Take 1 tablet by mouth daily at 12 noon.   123XX123   bismuth subsalicylate (PEPTO BISMOL) 262 MG/15ML suspension Take 30 mLs by mouth every 6 (six) hours as needed for indigestion.      Elastic Bandages & Supports (COMFORT FIT MATERNITY SUPP SM) MISC Wear as directed. (Patient not taking: Reported on 09/05/2018) 1 each 0    Feeding Supplies (BREASTMILK STORAGE BAGS) MISC 1  Bag by Does not apply route every 3 (three) hours. 100 each 0    ibuprofen (ADVIL) 800 MG tablet Take 800 mg by mouth every 8 (eight) hours as needed.      montelukast (SINGULAIR) 10 MG tablet Take 10 mg by mouth at bedtime.      naproxen (NAPROSYN) 500 MG tablet Take 1 tablet (500 mg total) by mouth 2 (two) times daily. 30 tablet 0     Review of Systems - General ROS: positive for  - chills, fatigue, and fever Psychological ROS: negative for - anxiety or depression Ophthalmic ROS: negative ENT ROS: negative Hematological and Lymphatic ROS: negative for - bleeding problems Endocrine ROS: negative for - palpitations or skin changes Respiratory ROS: no cough, shortness of breath, or wheezing Cardiovascular ROS: no chest pain or dyspnea on exertion Gastrointestinal ROS: no change in bowel habits, or black or bloody stools Genito-Urinary ROS: no dysuria, trouble voiding, or hematuria Musculoskeletal ROS: negative Neurological ROS: no TIA or stroke symptoms Dermatological ROS: positive for skin lesion changes  Vitals:  BP 124/60 (BP Location: Right Arm)   Pulse 98   Temp 98.8 F (37.1 C) (Oral)   Resp 18   Ht 5\' 1"  (1.549 m)   Wt 75.8 kg   LMP 02/21/2022 (Approximate)   SpO2 98%   BMI 31.59 kg/m  Physical Examination: CONSTITUTIONAL: appears ill EYES: Conjunctivae and EOM are normal. Pupils are equal, round, and reactive to light. No scleral icterus.  NECK: Normal range of motion, supple, no masses SKIN: Warm and diaphoretic NEUROLGIC: Alert and oriented to person, place, and time. Normal reflexes, muscle tone coordination. No cranial nerve deficit noted. PSYCHIATRIC: Normal mood and affect. Normal behavior. Normal judgment and thought content. CARDIOVASCULAR: Normal heart rate noted, regular rhythm RESPIRATORY: Effort and breath sounds normal, no problems with respiration noted ABDOMEN: Soft, +lower abdominal tenderness, no rebound, no guarding GU: Normal external genitalia,  vagina- pink moist mucosa with dark blood noted in vault.  Multiparous cervix noted- no products seen at os.  On bimanual exam- uterine tenderness noted. MUSCULOSKELETAL: Normal range of motion. No edema and no tenderness. 2+ distal pulses.  Labs:  Results for orders placed or performed during the hospital encounter of 05/28/22 (from the past 24 hour(s))  CBC with Differential/Platelet   Collection Time: 05/28/22  9:39 PM  Result Value Ref Range   WBC 11.8 (H) 4.0 - 10.5 K/uL   RBC 2.93 (L) 3.87 - 5.11 MIL/uL   Hemoglobin 8.7 (L) 12.0 - 15.0 g/dL   HCT 24.8 (L) 36.0 -  46.0 %   MCV 84.6 80.0 - 100.0 fL   MCH 29.7 26.0 - 34.0 pg   MCHC 35.1 30.0 - 36.0 g/dL   RDW 40.9 81.1 - 91.4 %   Platelets 277 150 - 400 K/uL   nRBC 0.0 0.0 - 0.2 %   Neutrophils Relative % 80 %   Neutro Abs 9.5 (H) 1.7 - 7.7 K/uL   Lymphocytes Relative 9 %   Lymphs Abs 1.0 0.7 - 4.0 K/uL   Monocytes Relative 9 %   Monocytes Absolute 1.1 (H) 0.1 - 1.0 K/uL   Eosinophils Relative 1 %   Eosinophils Absolute 0.1 0.0 - 0.5 K/uL   Basophils Relative 0 %   Basophils Absolute 0.0 0.0 - 0.1 K/uL   Immature Granulocytes 1 %   Abs Immature Granulocytes 0.06 0.00 - 0.07 K/uL  Comprehensive metabolic panel   Collection Time: 05/28/22  9:39 PM  Result Value Ref Range   Sodium 138 135 - 145 mmol/L   Potassium 3.9 3.5 - 5.1 mmol/L   Chloride 107 98 - 111 mmol/L   CO2 24 22 - 32 mmol/L   Glucose, Bld 94 70 - 99 mg/dL   BUN <5 (L) 6 - 20 mg/dL   Creatinine, Ser 7.82 0.44 - 1.00 mg/dL   Calcium 8.8 (L) 8.9 - 10.3 mg/dL   Total Protein 6.5 6.5 - 8.1 g/dL   Albumin 3.0 (L) 3.5 - 5.0 g/dL   AST 19 15 - 41 U/L   ALT 24 0 - 44 U/L   Alkaline Phosphatase 73 38 - 126 U/L   Total Bilirubin 0.5 0.3 - 1.2 mg/dL   GFR, Estimated >95 >62 mL/min   Anion gap 7 5 - 15  hCG, quantitative, pregnancy   Collection Time: 05/28/22  9:39 PM  Result Value Ref Range   hCG, Beta Chain, Quant, S 4,712 (H) <5 mIU/mL  Type and screen [facility]   Collection Time: 05/28/22 11:03 PM  Result Value Ref Range   ABO/RH(D) PENDING    Antibody Screen PENDING    Sample Expiration      05/31/2022,2359 Performed at [facility], [address] [place], [place] [address]     Imaging Studies: US OB LESS THAN 14 WEEKS WITH OB TRANSVAGINAL  Result Date: 05/26/2022 CLINICAL DATA:  Vaginal bleeding EXAM: OBSTETRIC <14 WK [place] AND TRANSVAGINAL OB US TECHNIQUE: Both transabdominal and transvaginal ultrasound examinations were performed for complete evaluation of the gestation as well as the maternal uterus, adnexal regions, and pelvic cul-de-sac. Transvaginal technique was performed to assess early pregnancy. COMPARISON:  None Available. FINDINGS: Intrauterine gestational sac: None Yolk sac:  Not Visualized. Embryo:  Not Visualized. Cardiac Activity: Not Visualized. Subchorionic hemorrhage:  None visualized. Maternal uterus/adnexae: Thickened and heterogeneous endometrium. Normal appearing left ovary. Right ovary is not visualized. No free fluid seen in the pelvis. IMPRESSION: 1. No intrauterine gestational sac, yolk sac, or fetal pole identified. In the setting of positive pregnancy test and no definite intrauterine pregnancy, this reflects a pregnancy of unknown location. Differential considerations include early normal IUP, abnormal IUP, or nonvisualized ectopic pregnancy. Differentiation is achieved with serial beta HCG supplemented by repeat sonography as clinically warranted. 2. Thickened and heterogeneous endometrium, possibly due to blood products. Electronically Signed   By: Allegra Lai M.D.   On: 05/26/2022 15:40     Repeat US completed today: I was at bedside during exam.  No free fluid noted.  Normal ovaries bilaterally.  Thickened endometrium appreciated. Final read pending.  Assessment and Plan: Septic miscarriage  -NPO -LR @ 150cc/hr -Gent/Vanc/Flagyl ordered -blood cultures collected -Differential  includes septic miscarriage vs ectopic pregnancy.  Based on clinical picture with fever, active bleeding and declining HCG findings are most suggestive of septic miscarriage -Plan for D&E under US guidance.  Risk/benefits and alternatives reviewed with patient including but not limited to risk of bleeding, infection and injury due to uterine perforation Questions and concerns were addressed, inform consent obtained -Main OR notified  Janyth Pupa, DO Attending [place], [place] for [employer], [place]

## 2022-05-28 NOTE — MAU Provider Note (Signed)
History     CSN: [id number]  Arrival date and time: 05/28/22 2043   Event Date/Time   First Provider Initiated Contact with Patient 05/28/22 2123      Chief Complaint  Patient presents with   Labs Only   Headache   [name] is a 36 y.o. year old [phone] female at [redacted]w[redacted]d weeks gestation who presents to MAU reporting she continues to have dull pelvic pain, cramping and red vaginal spotting (spotting same as when last in MAU). She also complains of a H/A of 10/10 with no relief from Tylenol; last taken at 1800. She reports seeing spots in her vision. She was seen in MAU on Friday 05/26/2022 and dx'd with PUL. She cam here tonight for the repeat HCG, but states her other sx's are still worrisome. The FOB is present and contributing to the history taking.    OB History     Gravida  5   Para  3   Term  2   Preterm  1   AB  1   Living  3      SAB  1   IAB      Ectopic      Multiple  0   Live Births  1           Past Medical History:  Diagnosis Date   Anemia    Diarrhea during pregnancy 07/16/2018   Nausea and vomiting of pregnancy, antepartum 03/04/2018   Preterm labor    Sickle cell trait [facility])    Supervision of other normal pregnancy, antepartum 02/25/2018    Nursing Staff Provider Office Location  [facility]  Dating  U/S Language  English Anatomy US  02-27-18 Flu Vaccine   Genetic Screen  NIPS:   AFP:   First Screen:  Quad:   TDaP vaccine   05-27-18 Hgb A1C or  GTT Early  Third trimester  Rhogam     LAB RESULTS  Feeding Plan Breast  Blood Type --/--/O POS, O POS Performed at [facility], [address] [place], [place] [address]  [phone] [id number])  Contra   Vaginal Pap smear, abnormal     Past Surgical History:  Procedure Laterality Date   CESAREAN SECTION     FIBULA FRACTURE SURGERY     TIBIA FRACTURE SURGERY     VULVA SURGERY     Hematoma   WISDOM TOOTH EXTRACTION      Family History  Problem Relation Age of Onset   Diabetes Mother     Diabetes Brother    Diabetes Maternal Aunt    Kidney disease Maternal Aunt    Diabetes Paternal Grandmother    Dementia Paternal Grandmother     Social History   Tobacco Use   Smoking status: Never   Smokeless tobacco: Never  Vaping Use   Vaping Use: Never used  Substance Use Topics   Alcohol use: Never   Drug use: Never    Allergies:  Allergies  Allergen Reactions   Amoxicillin Hives   Penicillins Hives and Nausea And Vomiting    Has patient had a PCN reaction causing immediate rash, facial/tongue/throat swelling, SOB or lightheadedness with hypotension: Yes, no anaphylaxis reaction.  Has patient had a PCN reaction causing severe rash involving mucus membranes or skin necrosis: Yes Has patient had a PCN reaction that required hospitalization: No Has patient had a PCN reaction occurring within the last 10 years: Yes If all of the above answers are "NO", then may proceed  with Cephalosporin use.     Medications Prior to Admission  Medication Sig Dispense Refill Last Dose   acetaminophen (TYLENOL) 325 MG tablet Take 650 mg by mouth every 6 (six) hours as needed for mild pain or headache.   05/28/2022   oxycodone (OXY-IR) 5 MG capsule Take 1 capsule (5 mg total) by mouth every 6 (six) hours as needed for up to 7 days (breakthrough pain). 10 capsule 0 05/28/2022   Prenatal Vit-Fe Fumarate-FA (PRENATAL MULTIVITAMIN) TABS tablet Take 1 tablet by mouth daily at 12 noon.   123XX123   bismuth subsalicylate (PEPTO BISMOL) 262 MG/15ML suspension Take 30 mLs by mouth every 6 (six) hours as needed for indigestion.      Elastic Bandages & Supports (COMFORT FIT MATERNITY SUPP SM) MISC Wear as directed. (Patient not taking: Reported on 09/05/2018) 1 each 0    Feeding Supplies (BREASTMILK STORAGE BAGS) MISC 1 Bag by Does not apply route every 3 (three) hours. 100 each 0    ibuprofen (ADVIL) 800 MG tablet Take 800 mg by mouth every 8 (eight) hours as needed.      montelukast (SINGULAIR) 10 MG  tablet Take 10 mg by mouth at bedtime.      naproxen (NAPROSYN) 500 MG tablet Take 1 tablet (500 mg total) by mouth 2 (two) times daily. 30 tablet 0     Review of Systems  Constitutional:  Positive for chills, fatigue and fever.  HENT: Negative.    Eyes:  Positive for visual disturbance (spots in vision).  Respiratory: Negative.    Cardiovascular: Negative.   Gastrointestinal: Negative.   Endocrine: Negative.   Genitourinary:  Positive for pelvic pain and vaginal bleeding.  Musculoskeletal: Negative.   Skin: Negative.   Allergic/Immunologic: Negative.   Neurological:  Positive for headaches.  Hematological: Negative.   Psychiatric/Behavioral: Negative.     Physical Exam   Patient Vitals for the past 24 hrs:  BP Temp Temp src Pulse Resp SpO2 Height Weight  05/28/22 2055 124/60 (!) 102.3 F (39.1 C) Oral (!) 108 18 99 % 5\' 1"  (1.549 m) 75.8 kg     Physical Exam Vitals and nursing note reviewed.  Constitutional:      Appearance: She is ill-appearing.  Cardiovascular:     Rate and Rhythm: Tachycardia present.     Pulses: Normal pulses.     Heart sounds: Normal heart sounds.  Pulmonary:     Effort: Pulmonary effort is normal.     Breath sounds: Normal breath sounds.  Abdominal:     Tenderness: There is abdominal tenderness. There is guarding.  Neurological:     Mental Status: She is alert and oriented to person, place, and time.  Psychiatric:        Mood and Affect: Mood normal.        Behavior: Behavior normal.        Thought Content: Thought content normal.        Judgment: Judgment normal.     MAU Course  Procedures  MDM Tylenol 1000 mg po  HCG CBC w/Diff CMP NPO  Start IV LR Bolus 1000 ml @ 999 ml/hr OB U/S <14 wks with TV  Results for orders placed or performed during the hospital encounter of 05/28/22 (from the past 24 hour(s))  CBC with Differential/Platelet     Status: Abnormal   Collection Time: 05/28/22  9:39 PM  Result Value Ref Range   WBC  11.8 (H) 4.0 - 10.5 K/uL   RBC 2.93 (L) 3.87 -  5.11 MIL/uL   Hemoglobin 8.7 (L) 12.0 - 15.0 g/dL   HCT 35.5 (L) 73.2 - 20.2 %   MCV 84.6 80.0 - 100.0 fL   MCH 29.7 26.0 - 34.0 pg   MCHC 35.1 30.0 - 36.0 g/dL   RDW 54.2 70.6 - 23.7 %   Platelets 277 150 - 400 K/uL   nRBC 0.0 0.0 - 0.2 %   Neutrophils Relative % 80 %   Neutro Abs 9.5 (H) 1.7 - 7.7 K/uL   Lymphocytes Relative 9 %   Lymphs Abs 1.0 0.7 - 4.0 K/uL   Monocytes Relative 9 %   Monocytes Absolute 1.1 (H) 0.1 - 1.0 K/uL   Eosinophils Relative 1 %   Eosinophils Absolute 0.1 0.0 - 0.5 K/uL   Basophils Relative 0 %   Basophils Absolute 0.0 0.0 - 0.1 K/uL   Immature Granulocytes 1 %   Abs Immature Granulocytes 0.06 0.00 - 0.07 K/uL     No results found.   *Consult with Dr. Charlotta Newton @ 2130 - notified of patient's complaints, assessments, previous lab & previous U/S results  Request made for Dr. Charlotta Newton to come see patient  orders received for NPO, IV and repeat U/S  Dr. Charlotta Newton at bedside @ 2147  Care assumed by Dr. Charlotta Newton @ 2200.  [name], CNM 05/28/2022, 9:32 PM  Assessment and Plan

## 2022-05-29 ENCOUNTER — Other Ambulatory Visit: Payer: Self-pay

## 2022-05-29 ENCOUNTER — Inpatient Hospital Stay (HOSPITAL_COMMUNITY): Admitting: Anesthesiology

## 2022-05-29 ENCOUNTER — Encounter (HOSPITAL_COMMUNITY): Payer: Self-pay | Admitting: Obstetrics & Gynecology

## 2022-05-29 DIAGNOSIS — O0387 Sepsis following complete or unspecified spontaneous abortion: Secondary | ICD-10-CM

## 2022-05-29 LAB — CBC
HCT: 25.5 % — ABNORMAL LOW (ref 36.0–46.0)
Hemoglobin: 9.1 g/dL — ABNORMAL LOW (ref 12.0–15.0)
MCH: 30.1 pg (ref 26.0–34.0)
MCHC: 35.7 g/dL (ref 30.0–36.0)
MCV: 84.4 fL (ref 80.0–100.0)
Platelets: 266 10*3/uL (ref 150–400)
RBC: 3.02 MIL/uL — ABNORMAL LOW (ref 3.87–5.11)
RDW: 13 % (ref 11.5–15.5)
WBC: 12.3 10*3/uL — ABNORMAL HIGH (ref 4.0–10.5)
nRBC: 0 % (ref 0.0–0.2)

## 2022-05-29 LAB — POCT I-STAT, CHEM 8
BUN: <3 mg/dL — ABNORMAL LOW (ref 6–20)
Calcium, Ion: 1.23 mmol/L (ref 1.15–1.40)
Chloride: 103 mmol/L (ref 98–111)
Creatinine, Ser: 0.6 mg/dL (ref 0.44–1.00)
Glucose, Bld: 101 mg/dL — ABNORMAL HIGH (ref 70–99)
HCT: 24 % — ABNORMAL LOW (ref 36.0–46.0)
Hemoglobin: 8.2 g/dL — ABNORMAL LOW (ref 12.0–15.0)
Potassium: 4.2 mmol/L (ref 3.5–5.1)
Sodium: 138 mmol/L (ref 135–145)
TCO2: 23 mmol/L (ref 22–32)

## 2022-05-29 LAB — GC/CHLAMYDIA PROBE AMP (~~LOC~~) NOT AT ARMC
Chlamydia: NEGATIVE
Comment: NEGATIVE
Comment: NORMAL
Neisseria Gonorrhea: NEGATIVE

## 2022-05-29 LAB — CULTURE, OB URINE: Culture: 10000 — AB

## 2022-05-29 MED ORDER — PROMETHAZINE HCL 25 MG/ML IJ SOLN: 6.2500 mg | INTRAMUSCULAR | Status: DC | PRN

## 2022-05-29 MED ORDER — KETOROLAC TROMETHAMINE 30 MG/ML IJ SOLN
INTRAMUSCULAR | Status: DC | PRN
  Administered 2022-05-29: 30 mg via INTRAVENOUS

## 2022-05-29 MED ORDER — ONDANSETRON HCL 4 MG/2ML IJ SOLN
INTRAMUSCULAR | Status: DC | PRN
  Administered 2022-05-29: 4 mg via INTRAVENOUS

## 2022-05-29 MED ORDER — VANCOMYCIN HCL IN DEXTROSE 1-5 GM/200ML-% IV SOLN
1000.0000 mg | Freq: Two times a day (BID) | INTRAVENOUS | Status: DC
  Filled 2022-05-29: qty 200

## 2022-05-29 MED ORDER — SUGAMMADEX SODIUM 200 MG/2ML IV SOLN
INTRAVENOUS | Status: DC | PRN
  Administered 2022-05-29: 200 mg via INTRAVENOUS

## 2022-05-29 MED ORDER — LIDOCAINE HCL (CARDIAC) PF 50 MG/5ML IV SOSY
PREFILLED_SYRINGE | INTRAVENOUS | Status: DC | PRN
  Administered 2022-05-29: 60 mg via INTRAVENOUS

## 2022-05-29 MED ORDER — LACTATED RINGERS IV SOLN
INTRAVENOUS | Status: DC
  Administered 2022-05-29 – 2022-05-30 (×2): 150 mL/h via INTRAVENOUS

## 2022-05-29 MED ORDER — PROPOFOL 10 MG/ML IV BOLUS
INTRAVENOUS | Status: AC
  Filled 2022-05-29: qty 20

## 2022-05-29 MED ORDER — OXYCODONE HCL 5 MG PO TABS: 5.0000 mg | ORAL_TABLET | Freq: Once | ORAL | Status: DC | PRN

## 2022-05-29 MED ORDER — GENTAMICIN SULFATE 40 MG/ML IJ SOLN: 5.0000 mg/kg | INTRAVENOUS | Status: DC

## 2022-05-29 MED ORDER — ONDANSETRON HCL 4 MG PO TABS: 4.0000 mg | ORAL_TABLET | Freq: Four times a day (QID) | ORAL | Status: DC | PRN

## 2022-05-29 MED ORDER — IBUPROFEN 600 MG PO TABS
600.0000 mg | ORAL_TABLET | Freq: Four times a day (QID) | ORAL | Status: DC | PRN
Start: 2022-05-29 — End: 2022-05-30
  Administered 2022-05-29: 600 mg via ORAL
  Filled 2022-05-29: qty 1

## 2022-05-29 MED ORDER — SODIUM CHLORIDE 0.9 % IV SOLN
1.0000 g | Freq: Three times a day (TID) | INTRAVENOUS | Status: DC
  Administered 2022-05-29 – 2022-05-30 (×3): 1 g via INTRAVENOUS
  Filled 2022-05-29 (×4): qty 20

## 2022-05-29 MED ORDER — 0.9 % SODIUM CHLORIDE (POUR BTL) OPTIME
TOPICAL | Status: DC | PRN
  Administered 2022-05-29: 1000 mL

## 2022-05-29 MED ORDER — ROCURONIUM BROMIDE 100 MG/10ML IV SOLN
INTRAVENOUS | Status: DC | PRN
  Administered 2022-05-29: 25 mg via INTRAVENOUS

## 2022-05-29 MED ORDER — ONDANSETRON HCL 4 MG/2ML IJ SOLN: 4.0000 mg | Freq: Four times a day (QID) | INTRAMUSCULAR | Status: DC | PRN

## 2022-05-29 MED ORDER — SUCCINYLCHOLINE CHLORIDE 20 MG/ML IJ SOLN
INTRAMUSCULAR | Status: DC | PRN
  Administered 2022-05-29: 120 mg via INTRAVENOUS

## 2022-05-29 MED ORDER — MONTELUKAST SODIUM 10 MG PO TABS
10.0000 mg | ORAL_TABLET | Freq: Every day | ORAL | Status: DC
Start: 2022-05-29 — End: 2022-05-30
  Administered 2022-05-29: 10 mg via ORAL
  Filled 2022-05-29 (×3): qty 1

## 2022-05-29 MED ORDER — ACETAMINOPHEN 325 MG PO TABS
650.0000 mg | ORAL_TABLET | Freq: Four times a day (QID) | ORAL | Status: DC | PRN
  Administered 2022-05-29: 650 mg via ORAL
  Filled 2022-05-29 (×2): qty 2

## 2022-05-29 MED ORDER — LACTATED RINGERS IV SOLN: INTRAVENOUS | Status: DC | PRN

## 2022-05-29 MED ORDER — METRONIDAZOLE 500 MG/100ML IV SOLN: 500.0000 mg | Freq: Two times a day (BID) | INTRAVENOUS | Status: DC

## 2022-05-29 MED ORDER — FENTANYL CITRATE (PF) 100 MCG/2ML IJ SOLN: 25.0000 ug | INTRAMUSCULAR | Status: DC | PRN

## 2022-05-29 MED ORDER — METHYLERGONOVINE MALEATE 0.2 MG/ML IJ SOLN
INTRAMUSCULAR | Status: AC
  Filled 2022-05-29: qty 1

## 2022-05-29 MED ORDER — SODIUM CHLORIDE 0.9 % IV SOLN
1.0000 g | Freq: Three times a day (TID) | INTRAVENOUS | Status: DC
  Filled 2022-05-29: qty 20

## 2022-05-29 MED ORDER — MIDAZOLAM HCL 5 MG/5ML IJ SOLN
INTRAMUSCULAR | Status: DC | PRN
  Administered 2022-05-29: 1 mg via INTRAVENOUS

## 2022-05-29 MED ORDER — DOCUSATE SODIUM 100 MG PO CAPS
100.0000 mg | ORAL_CAPSULE | Freq: Two times a day (BID) | ORAL | Status: DC
  Administered 2022-05-29 – 2022-05-30 (×3): 100 mg via ORAL
  Filled 2022-05-29 (×3): qty 1

## 2022-05-29 MED ORDER — DEXAMETHASONE SODIUM PHOSPHATE 4 MG/ML IJ SOLN
INTRAMUSCULAR | Status: DC | PRN
  Administered 2022-05-29: 10 mg via INTRAVENOUS

## 2022-05-29 MED ORDER — VANCOMYCIN HCL IN DEXTROSE 1-5 GM/200ML-% IV SOLN: 1000.0000 mg | INTRAVENOUS | Status: DC

## 2022-05-29 MED ORDER — METOCLOPRAMIDE HCL 5 MG/ML IJ SOLN
10.0000 mg | Freq: Once | INTRAMUSCULAR | Status: AC
  Administered 2022-05-29: 10 mg via INTRAVENOUS
  Filled 2022-05-29: qty 2

## 2022-05-29 MED ORDER — PROPOFOL 10 MG/ML IV BOLUS
INTRAVENOUS | Status: DC | PRN
  Administered 2022-05-29: 150 mg via INTRAVENOUS
  Administered 2022-05-29: 50 mg via INTRAVENOUS

## 2022-05-29 MED ORDER — METHYLERGONOVINE MALEATE 0.2 MG/ML IJ SOLN
INTRAMUSCULAR | Status: DC | PRN
  Administered 2022-05-29: .2 mg via INTRAMUSCULAR

## 2022-05-29 MED ORDER — OXYCODONE HCL 5 MG/5ML PO SOLN: 5.0000 mg | Freq: Once | ORAL | Status: DC | PRN

## 2022-05-29 MED ORDER — FENTANYL CITRATE (PF) 100 MCG/2ML IJ SOLN
INTRAMUSCULAR | Status: DC | PRN
Start: 2022-05-29 — End: 2022-05-29
  Administered 2022-05-29 (×2): 50 ug via INTRAVENOUS

## 2022-05-29 NOTE — Anesthesia Preprocedure Evaluation (Addendum)
Anesthesia Evaluation  Patient identified by MRN, date of birth, ID band Patient awake    Reviewed: Allergy & Precautions, NPO status , Patient's Chart, lab work & pertinent test results  History of Anesthesia Complications Negative for: history of anesthetic complications  Airway Mallampati: II  TM Distance: >3 FB Neck ROM: Full    Dental  (+) Dental Advisory Given, Chipped,    Pulmonary neg pulmonary ROS,    Pulmonary exam normal        Cardiovascular negative cardio ROS Normal cardiovascular exam     Neuro/Psych negative neurological ROS  negative psych ROS   GI/Hepatic negative GI ROS, Neg liver ROS,   Endo/Other   Obesity   Renal/GU negative Renal ROS     Musculoskeletal negative musculoskeletal ROS (+)   Abdominal   Peds  Hematology  (+) Blood dyscrasia, Sickle cell trait and anemia ,   Anesthesia Other Findings   Reproductive/Obstetrics  Septic abortion                             Anesthesia Physical Anesthesia Plan  ASA: 2 and emergent  Anesthesia Plan: General   Post-op Pain Management: Tylenol PO (pre-op)* and Toradol IV (intra-op)*   Induction: Intravenous  PONV Risk Score and Plan: 3 and Treatment may vary due to age or medical condition, Ondansetron, Dexamethasone and Midazolam  Airway Management Planned: Oral ETT  Additional Equipment: None  Intra-op Plan:   Post-operative Plan: Extubation in OR  Informed Consent: I have reviewed the patients History and Physical, chart, labs and discussed the procedure including the risks, benefits and alternatives for the proposed anesthesia with the patient or authorized representative who has indicated his/her understanding and acceptance.     Dental advisory given  Plan Discussed with: CRNA and Anesthesiologist  Anesthesia Plan Comments:        Anesthesia Quick Evaluation

## 2022-05-29 NOTE — Anesthesia Procedure Notes (Signed)
Procedure Name: Intubation Date/Time: 05/29/2022 12:39 AM  Performed by: Edmonia Caprio, CRNAPre-anesthesia Checklist: Patient identified, Emergency Drugs available, Suction available, Patient being monitored and Timeout performed Patient Re-evaluated:Patient Re-evaluated prior to induction Oxygen Delivery Method: Circle system utilized Preoxygenation: Pre-oxygenation with 100% oxygen Induction Type: IV induction and Rapid sequence Laryngoscope Size: Miller and 2 Grade View: Grade I Tube type: Oral Tube size: 7.0 mm Number of attempts: 1 Airway Equipment and Method: Stylet Placement Confirmation: ETT inserted through vocal cords under direct vision, positive ETCO2 and breath sounds checked- equal and bilateral Secured at: 23 cm Tube secured with: Tape Dental Injury: Teeth and Oropharynx as per pre-operative assessment

## 2022-05-29 NOTE — Transfer of Care (Signed)
Immediate Anesthesia Transfer of Care Note  Patient: Christy [name]  Procedure(s) Performed: DILATATION AND CURETTAGE WITH INTRAOPERATIVE ULTRASOUND  Patient Location: PACU  Anesthesia Type:General  Level of Consciousness: drowsy and responds to stimulation  Airway & Oxygen Therapy: Patient Spontanous Breathing and Patient connected to nasal cannula oxygen  Post-op Assessment: Report given to RN, Post -op Vital signs reviewed and stable and Patient moving all extremities X 4  Post vital signs: Reviewed and stable  Last Vitals:  Vitals Value Taken Time  BP 110/57 05/29/22 0130  Temp 36.2 C 05/29/22 0125  Pulse 81 05/29/22 0136  Resp 22 05/29/22 0136  SpO2 100 % 05/29/22 0136  Vitals shown include unvalidated device data.  Last Pain:  Vitals:   05/29/22 0125  TempSrc:   PainSc: 0-No pain         Complications: No notable events documented.

## 2022-05-29 NOTE — Op Note (Signed)
Preoperative diagnosis: Septic AB  Postoperative diagnosis: Same  Anesthesia: General   Procedure: Dilatation and evacuation under US guidance  Surgeon: Dr. Myna Hidalgo  Estimated blood loss: 100c UOP: 100cc IVF: 500cc  Procedure:  Pt was taken to the OR where general anesthesia was performed.  She was placed in lithotomy position.  She was prepped and draped in a sterile fashion.  The bladder was drained using a red rubber.  A sterile speculum is inserted in the vagina and the anterior lip of the cervix was grasped with a ring forcep. The cervix appeared dilated.  The uterus was then sounded at 8 cm. Using a #7 curved cannula, we proceed with evacuation of products of conception without difficulty. On [place], it was evident that there was significant products with inadequate evacuation.  The curved cannula was switched to #10 and placed under direct visualization.  Evacuation was completed with several passes.  Sharp curettage of the uterine cavity to confirm complete evacuation was completed and guided by [place].  The lining appeared thin.  Brisk bleeding was noted from the uterine os, fundal massage and Methergine IM was given.  Bleeding noted to improve.  [place] confirmed thin lining with minimal blood noted in uterus.  Instruments are then removed. Instrument and sponge count is complete x2.   Estimated blood loss is minimal.  The procedure is very well tolerated by the patient is taken to recovery room in a well and stable condition.  Specimen: Products of conception sent to pathology, culture was also obtained  Myna Hidalgo, DO Attending Obstetrician & Gynecologist, [facility] for [facility], [facility]

## 2022-05-29 NOTE — Progress Notes (Signed)
Postop PROGRESS NOTE  POD #1  Subjective:  Christy [name] is a 36 y.o. [phone] s/p D&E due to septic miscarrage at [redacted]w[redacted]d. Today she notes that she is starting to feel better.  Still having chills, but not as frequent.  Denies nausea/vomiting.  Tolerating some ice chips.  Not yet ambulating.   Moderate bleeding.  Objective: Blood pressure 105/60, pulse 82, temperature 98 F (36.7 C), temperature source Axillary, resp. rate 18, height 5\' 1"  (1.549 m), weight 75.8 kg, last menstrual period 02/21/2022, SpO2 94 %, unknown if currently breastfeeding.  Physical Exam:  General: no distress, improved from yesterday but still ill-appearing Chest: CTAB Heart: regular rate and rhythm Abdomen: soft, no rebound, no guarding, minimal uterine tenderness DVT Evaluation: No calf swelling or tenderness Extremities: no edema, SCDs in place Skin: diaphoretic  Results for orders placed or performed during the hospital encounter of 05/28/22 (from the past 24 hour(s))  CBC with Differential/Platelet     Status: Abnormal   Collection Time: 05/28/22  9:39 PM  Result Value Ref Range   WBC 11.8 (H) 4.0 - 10.5 K/uL   RBC 2.93 (L) 3.87 - 5.11 MIL/uL   Hemoglobin 8.7 (L) 12.0 - 15.0 g/dL   HCT 05/30/22 (L) 45.4 - 09.8 %   MCV 84.6 80.0 - 100.0 fL   MCH 29.7 26.0 - 34.0 pg   MCHC 35.1 30.0 - 36.0 g/dL   RDW 11.9 14.7 - 82.9 %   Platelets 277 150 - 400 K/uL   nRBC 0.0 0.0 - 0.2 %   Neutrophils Relative % 80 %   Neutro Abs 9.5 (H) 1.7 - 7.7 K/uL   Lymphocytes Relative 9 %   Lymphs Abs 1.0 0.7 - 4.0 K/uL   Monocytes Relative 9 %   Monocytes Absolute 1.1 (H) 0.1 - 1.0 K/uL   Eosinophils Relative 1 %   Eosinophils Absolute 0.1 0.0 - 0.5 K/uL   Basophils Relative 0 %   Basophils Absolute 0.0 0.0 - 0.1 K/uL   Immature Granulocytes 1 %   Abs Immature Granulocytes 0.06 0.00 - 0.07 K/uL  Comprehensive metabolic panel     Status: Abnormal   Collection Time: 05/28/22  9:39 PM  Result Value Ref Range    Sodium 138 135 - 145 mmol/L   Potassium 3.9 3.5 - 5.1 mmol/L   Chloride 107 98 - 111 mmol/L   CO2 24 22 - 32 mmol/L   Glucose, Bld 94 70 - 99 mg/dL   BUN <5 (L) 6 - 20 mg/dL   Creatinine, Ser 05/30/22 0.44 - 1.00 mg/dL   Calcium 8.8 (L) 8.9 - 10.3 mg/dL   Total Protein 6.5 6.5 - 8.1 g/dL   Albumin 3.0 (L) 3.5 - 5.0 g/dL   AST 19 15 - 41 U/L   ALT 24 0 - 44 U/L   Alkaline Phosphatase 73 38 - 126 U/L   Total Bilirubin 0.5 0.3 - 1.2 mg/dL   GFR, Estimated 1.30 >86 mL/min   Anion gap 7 5 - 15  hCG, quantitative, pregnancy     Status: Abnormal   Collection Time: 05/28/22  9:39 PM  Result Value Ref Range   hCG, Beta Chain, Quant, S 4,712 (H) <5 mIU/mL  Type and screen [facility]     Status: None   Collection Time: 05/28/22 11:03 PM  Result Value Ref Range   ABO/RH(D) O POS    Antibody Screen NEG    Sample Expiration      05/31/2022,2359  Performed at [facility], [address], [place], [place] [address]   Aerobic/Anaerobic Culture w Gram Stain (surgical/deep wound)     Status: None (Preliminary result)   Collection Time: 05/29/22 12:53 AM   Specimen: Wound  Result Value Ref Range   Specimen Description WOUND    Special Requests UTERINE CONTENTS ID A    Gram Stain      FEW WBC PRESENT,BOTH PMN AND MONONUCLEAR FEW GRAM POSITIVE COCCI IN PAIRS AND CHAINS FEW GRAM NEGATIVE RODS Performed at [facility], [address] [place], [place] [address]    Culture PENDING    Report Status PENDING   I-STAT, chem 8     Status: Abnormal   Collection Time: 05/29/22  1:33 AM  Result Value Ref Range   Sodium 138 135 - 145 mmol/L   Potassium 4.2 3.5 - 5.1 mmol/L   Chloride 103 98 - 111 mmol/L   BUN <3 (L) 6 - 20 mg/dL   Creatinine, Ser 9.62 0.44 - 1.00 mg/dL   Glucose, Bld 229 (H) 70 - 99 mg/dL   Calcium, Ion 7.98 9.21 - 1.40 mmol/L   TCO2 23 22 - 32 mmol/L   Hemoglobin 8.2 (L) 12.0 - 15.0 g/dL   HCT 19.4 (L) 17.4 - 08.1 %  CBC     Status: Abnormal    Collection Time: 05/29/22  4:19 AM  Result Value Ref Range   WBC 12.3 (H) 4.0 - 10.5 K/uL   RBC 3.02 (L) 3.87 - 5.11 MIL/uL   Hemoglobin 9.1 (L) 12.0 - 15.0 g/dL   HCT 44.8 (L) 18.5 - 63.1 %   MCV 84.4 80.0 - 100.0 fL   MCH 30.1 26.0 - 34.0 pg   MCHC 35.7 30.0 - 36.0 g/dL   RDW 49.7 02.6 - 37.8 %   Platelets 266 150 - 400 K/uL   nRBC 0.0 0.0 - 0.2 %    Assessment/Plan: Christy [name] is a 36 y.o. [phone] s/p D&E due to septic miscarriage  -continue IV Vanc/Gent/Flagyl -Pain management with ibuprofen/tylenol -continue LR @ 150cc/hr -uterine and blood culture pending  Dispo: Continue inpatient management for 24-48hr until pt afebrile and clinical improvement.   LOS: 1 day   Christy Hidalgo, DO Attending Obstetrician & Gynecologist, [facility] for [employer], [facility]

## 2022-05-30 ENCOUNTER — Encounter (HOSPITAL_COMMUNITY): Payer: Self-pay | Admitting: Obstetrics & Gynecology

## 2022-05-30 ENCOUNTER — Telehealth: Payer: Self-pay | Admitting: General Practice

## 2022-05-30 ENCOUNTER — Other Ambulatory Visit: Payer: Self-pay | Admitting: Obstetrics and Gynecology

## 2022-05-30 ENCOUNTER — Other Ambulatory Visit (HOSPITAL_COMMUNITY): Payer: Self-pay

## 2022-05-30 DIAGNOSIS — R102 Pelvic and perineal pain: Secondary | ICD-10-CM

## 2022-05-30 DIAGNOSIS — O0387 Sepsis following complete or unspecified spontaneous abortion: Principal | ICD-10-CM

## 2022-05-30 LAB — CBC WITH DIFFERENTIAL/PLATELET
Abs Immature Granulocytes: 0.12 10*3/uL — ABNORMAL HIGH (ref 0.00–0.07)
Basophils Absolute: 0 10*3/uL (ref 0.0–0.1)
Basophils Relative: 0 %
Eosinophils Absolute: 0 10*3/uL (ref 0.0–0.5)
Eosinophils Relative: 0 %
HCT: 20.2 % — ABNORMAL LOW (ref 36.0–46.0)
Hemoglobin: 7.2 g/dL — ABNORMAL LOW (ref 12.0–15.0)
Immature Granulocytes: 1 %
Lymphocytes Relative: 13 %
Lymphs Abs: 1.6 10*3/uL (ref 0.7–4.0)
MCH: 30.4 pg (ref 26.0–34.0)
MCHC: 35.6 g/dL (ref 30.0–36.0)
MCV: 85.2 fL (ref 80.0–100.0)
Monocytes Absolute: 1 10*3/uL (ref 0.1–1.0)
Monocytes Relative: 8 %
Neutro Abs: 10 10*3/uL — ABNORMAL HIGH (ref 1.7–7.7)
Neutrophils Relative %: 78 %
Platelets: 269 10*3/uL (ref 150–400)
RBC: 2.37 MIL/uL — ABNORMAL LOW (ref 3.87–5.11)
RDW: 13 % (ref 11.5–15.5)
WBC: 12.8 10*3/uL — ABNORMAL HIGH (ref 4.0–10.5)
nRBC: 0 % (ref 0.0–0.2)

## 2022-05-30 LAB — SURGICAL PATHOLOGY

## 2022-05-30 MED ORDER — METRONIDAZOLE 500 MG PO TABS
500.0000 mg | ORAL_TABLET | Freq: Two times a day (BID) | ORAL | 0 refills | Status: AC
  Filled 2022-05-30: qty 24, 12d supply, fill #0

## 2022-05-30 MED ORDER — DOXYCYCLINE HYCLATE 100 MG PO TABS
100.0000 mg | ORAL_TABLET | Freq: Two times a day (BID) | ORAL | Status: DC
  Administered 2022-05-30: 100 mg via ORAL
  Filled 2022-05-30: qty 1

## 2022-05-30 MED ORDER — IBUPROFEN 600 MG PO TABS: 600.0000 mg | ORAL_TABLET | Freq: Three times a day (TID) | ORAL | Status: DC | PRN

## 2022-05-30 MED ORDER — DOXYCYCLINE HYCLATE 100 MG PO TABS
100.0000 mg | ORAL_TABLET | Freq: Two times a day (BID) | ORAL | 0 refills | Status: AC
  Filled 2022-05-30: qty 24, 12d supply, fill #0

## 2022-05-30 MED ORDER — MENTHOL 3 MG MT LOZG
1.0000 | LOZENGE | OROMUCOSAL | Status: DC | PRN
  Administered 2022-05-30: 3 mg via ORAL
  Filled 2022-05-30 (×2): qty 9

## 2022-05-30 MED ORDER — SODIUM CHLORIDE 0.9 % IV SOLN
200.0000 mg | Freq: Once | INTRAVENOUS | Status: AC
  Administered 2022-05-30: 200 mg via INTRAVENOUS
  Filled 2022-05-30: qty 10

## 2022-05-30 MED ORDER — METRONIDAZOLE 500 MG PO TABS
500.0000 mg | ORAL_TABLET | Freq: Two times a day (BID) | ORAL | Status: DC
Start: 2022-05-30 — End: 2022-05-30
  Administered 2022-05-30: 500 mg via ORAL
  Filled 2022-05-30: qty 1

## 2022-05-30 MED ORDER — DOCUSATE SODIUM 100 MG PO CAPS: 100.0000 mg | ORAL_CAPSULE | Freq: Two times a day (BID) | ORAL | 0 refills | Status: DC | PRN

## 2022-05-30 NOTE — Plan of Care (Signed)

## 2022-05-30 NOTE — Telephone Encounter (Signed)
Called patient to review iron infusion appt, phone immediately goes to voicemail. Left message to check mychart account for more information.

## 2022-05-30 NOTE — Anesthesia Postprocedure Evaluation (Signed)
Anesthesia Post Note  Patient: Christy [name]  Procedure(s) Performed: DILATATION AND CURETTAGE WITH INTRAOPERATIVE ULTRASOUND     Patient location during evaluation: PACU Anesthesia Type: General Level of consciousness: awake and alert Pain management: pain level controlled Vital Signs Assessment: post-procedure vital signs reviewed and stable Respiratory status: spontaneous breathing, nonlabored ventilation and respiratory function stable Cardiovascular status: stable and blood pressure returned to baseline Anesthetic complications: no   No notable events documented.  Last Vitals:  Vitals:   05/30/22 0341 05/30/22 0755  BP: (!) 103/54 (!) 100/47  Pulse: 79 96  Resp: 18 17  Temp: 36.6 C 36.8 C  SpO2: 99% 98%    Last Pain:  Vitals:   05/30/22 0755  TempSrc: Oral  PainSc: 0-No pain                 Beryle Lathe

## 2022-05-30 NOTE — Discharge Summary (Signed)
Physician Discharge Summary  Patient ID: Christy [name] MRN: [id number] DOB/AGE: 07-Sep-1986 36 y.o.  Admit date: 05/28/2022 Discharge date: 05/30/2022  Admission Diagnoses:  Discharge Diagnoses:  Principal Problem:   Pelvic pain   Discharged Condition: {condition:18240}  Hospital Course: ***  Consults: {consultation:18241}  Significant Diagnostic Studies: {diagnostics:18242}  Treatments: {Tx:18249}  Discharge Exam: Blood pressure (!) 100/47, pulse 96, temperature 98.2 F (36.8 C), temperature source Oral, resp. rate 17, height 5\' 1"  (1.549 m), weight 75.8 kg, last menstrual period 02/21/2022, SpO2 98 %, unknown if currently breastfeeding. {physical 02/23/2022  Disposition: Discharge disposition: 01-Home or Self Care       Discharge Instructions     Discharge patient   Complete by: As directed    After AM abx given and meds to beds   Discharge disposition: 01-Home or Self Care   Discharge patient date: 05/30/2022      Allergies as of 05/30/2022       Reactions   Amoxicillin Hives   Penicillins Hives, Nausea And Vomiting   Has patient had a PCN reaction causing immediate rash, facial/tongue/throat swelling, SOB or lightheadedness with hypotension: Yes, no anaphylaxis reaction.  Has patient had a PCN reaction causing severe rash involving mucus membranes or skin necrosis: Yes Has patient had a PCN reaction that required hospitalization: No Has patient had a PCN reaction occurring within the last 10 years: Yes If all of the above answers are "NO", then may proceed with Cephalosporin use.        Medication List     STOP taking these medications    naproxen 500 MG tablet Commonly known as: NAPROSYN       TAKE these medications    acetaminophen 325 MG tablet Commonly known as: TYLENOL Take 650 mg by mouth every 6 (six) hours as needed for mild pain or headache.   bismuth subsalicylate 262 MG/15ML suspension Commonly known as: PEPTO  BISMOL Take 30 mLs by mouth every 6 (six) hours as needed for indigestion.   Breastmilk Storage Bags Misc 1 Bag by Does not apply route every 3 (three) hours.   Comfort Fit Maternity Supp Sm Misc Wear as directed.   docusate sodium 100 MG capsule Commonly known as: COLACE Take 1 capsule (100 mg total) by mouth 2 (two) times daily as needed for mild constipation.   doxycycline 100 MG tablet Commonly known as: VIBRA-TABS Take 1 tablet (100 mg total) by mouth every 12 (twelve) hours for 12 days.   ibuprofen 600 MG tablet Commonly known as: ADVIL Take 1 tablet (600 mg total) by mouth every 8 (eight) hours as needed. What changed:  medication strength how much to take   metroNIDAZOLE 500 MG tablet Commonly known as: FLAGYL Take 1 tablet (500 mg total) by mouth every 12 (twelve) hours for 12 days.   montelukast 10 MG tablet Commonly known as: SINGULAIR Take 10 mg by mouth at bedtime.   oxycodone 5 MG capsule Commonly known as: OXY-IR Take 1 capsule (5 mg total) by mouth every 6 (six) hours as needed for up to 7 days (breakthrough pain).   prenatal multivitamin Tabs tablet Take 1 tablet by mouth daily at 12 noon.        Follow-up Information     [facility]. Go in 1 week(s).   Specialty: Obstetrics and Gynecology Why: call the office on thursday if you haven't heard about your appointment Contact information: [address] [place] [phone]  SignedRandolph Bing 05/30/2022, 8:54 AM

## 2022-06-01 LAB — AEROBIC/ANAEROBIC CULTURE W GRAM STAIN (SURGICAL/DEEP WOUND)

## 2022-06-02 LAB — CULTURE, BLOOD (ROUTINE X 2)
Culture: NO GROWTH
Culture: NO GROWTH
Special Requests: ADEQUATE
Special Requests: ADEQUATE

## 2022-06-06 ENCOUNTER — Encounter (HOSPITAL_COMMUNITY)

## 2022-06-07 ENCOUNTER — Other Ambulatory Visit (HOSPITAL_COMMUNITY): Payer: Self-pay

## 2022-06-08 ENCOUNTER — Encounter: Admitting: Certified Nurse Midwife

## 2022-06-22 ENCOUNTER — Encounter (HOSPITAL_COMMUNITY)

## 2022-06-27 ENCOUNTER — Non-Acute Institutional Stay (HOSPITAL_COMMUNITY)
Admission: RE | Admit: 2022-06-27 | Discharge: 2022-06-27 | Disposition: A | Discharge status: Home / Self Care | Source: Ambulatory Visit | Attending: Internal Medicine | Admitting: Internal Medicine

## 2022-06-27 DIAGNOSIS — D62 Acute posthemorrhagic anemia: Secondary | ICD-10-CM | POA: Diagnosis present

## 2022-06-27 MED ORDER — SODIUM CHLORIDE 0.9 % IV SOLN: INTRAVENOUS | Status: DC | PRN

## 2022-06-27 MED ORDER — SODIUM CHLORIDE 0.9 % IV SOLN
300.0000 mg | Freq: Once | INTRAVENOUS | Status: AC
Start: 2022-06-27 — End: 2022-06-27
  Administered 2022-06-27: 300 mg via INTRAVENOUS
  Filled 2022-06-27: qty 15

## 2022-06-27 NOTE — Progress Notes (Signed)
PATIENT CARE CENTER NOTE   Diagnosis: Anemia    Provider: Towns Bing, MD   Procedure: Venofer 300 mg    Note:  Patient received Venofer 300 mg infusion (dose # 1 of 1) via PIV. No pre-medications ordered. Patient tolerated infusion well with no adverse reaction. Observed patient for 30 minutes post infusion. Vital signs stable. Discharge instructions given. Patient alert, oriented and ambulatory at discharge.

## 2022-07-05 NOTE — Progress Notes (Signed)
ICD 10 code D62  Acute blood loss anemia

## 2022-08-22 ENCOUNTER — Other Ambulatory Visit (HOSPITAL_COMMUNITY): Payer: Self-pay

## 2022-09-25 ENCOUNTER — Emergency Department (HOSPITAL_COMMUNITY)
Admission: EM | Admit: 2022-09-25 | Discharge: 2022-09-26 | Discharge status: Against Medical Advice | Attending: Emergency Medicine | Admitting: Emergency Medicine

## 2022-09-25 ENCOUNTER — Other Ambulatory Visit: Payer: Self-pay

## 2022-09-25 ENCOUNTER — Emergency Department (HOSPITAL_COMMUNITY)

## 2022-09-25 DIAGNOSIS — Z5321 Procedure and treatment not carried out due to patient leaving prior to being seen by health care provider: Secondary | ICD-10-CM | POA: Insufficient documentation

## 2022-09-25 DIAGNOSIS — R059 Cough, unspecified: Secondary | ICD-10-CM | POA: Diagnosis present

## 2022-09-25 DIAGNOSIS — Z20822 Contact with and (suspected) exposure to covid-19: Secondary | ICD-10-CM | POA: Insufficient documentation

## 2022-09-25 LAB — RESP PANEL BY RT-PCR (FLU A&B, COVID) ARPGX2
Influenza A by PCR: NEGATIVE
Influenza B by PCR: NEGATIVE
SARS Coronavirus 2 by RT PCR: NEGATIVE

## 2022-09-25 NOTE — ED Provider Triage Note (Signed)
Emergency Medicine Provider Triage Evaluation Note  Christy [name] , a 36 y.o. female  was evaluated in triage.  Pt complains of cough for the past week.  Denies fever, SOB, chest pain but feels "bubbles".  Son recent with URI symptoms as well.  No covid/flu exposure.  Review of Systems  Positive: cough Negative: fever  Physical Exam  BP (!) 147/76 (BP Location: Right Arm)   Pulse 70   Temp 98 F (36.7 C) (Oral)   Resp 16   Ht 5\' 1"  (1.549 m)   Wt 76 kg   SpO2 97%   BMI 31.66 kg/m  Gen:   Awake, no distress   Resp:  Normal effort  MSK:   Moves extremities without difficulty  Other:  No coughing observed during exam  Medical Decision Making  Medically screening exam initiated at 10:50 PM.  Appropriate orders placed.  Christy [name] was informed that the remainder of the evaluation will be completed by another provider, this initial triage assessment does not replace that evaluation, and the importance of remaining in the ED until their evaluation is complete.  Cough.  No chest pain or SOB.  Afebrile, non-toxic.  Covid/flu screen, CXR.   Jeannetta Ellis, PA-C 09/25/22 2251

## 2022-09-25 NOTE — ED Triage Notes (Signed)
Pt arrived POV complaining of coughing for about 1wk.  Denies fever/chills and denies being around anybody with COVID or the flu.

## 2022-09-26 ENCOUNTER — Ambulatory Visit
Admission: EM | Admit: 2022-09-26 | Discharge: 2022-09-26 | Disposition: A | Discharge status: Home / Self Care | Attending: Physician Assistant | Admitting: Physician Assistant

## 2022-09-26 DIAGNOSIS — J209 Acute bronchitis, unspecified: Secondary | ICD-10-CM

## 2022-09-26 MED ORDER — PROMETHAZINE-DM 6.25-15 MG/5ML PO SYRP: 5.0000 mL | ORAL_SOLUTION | Freq: Four times a day (QID) | ORAL | 0 refills | Status: DC | PRN

## 2022-09-26 MED ORDER — PREDNISONE 20 MG PO TABS: 40.0000 mg | ORAL_TABLET | Freq: Every day | ORAL | 0 refills | Status: AC

## 2022-09-26 NOTE — ED Notes (Signed)
No answer with name call x3.

## 2022-09-26 NOTE — ED Provider Notes (Signed)
[facility]    CSN: [id number] Arrival date & time: 09/26/22  1209      History   Chief Complaint Chief Complaint  Patient presents with   Cough    HPI Christy [name] is a 36 y.o. female.   Patient here today for evaluation of cough that she has had for the last week and a half.  She reports that she did have x-ray in the ED yesterday but she eloped before she was able to have evaluation.  She reports that she has not had any fever but states she has worsening cough when she lies down.  She has tried tea, steam for treatment without significant improvement.  She denies history of asthma.  She has not had for throat, nausea or vomiting.She is here today with other family members with similar symptoms.  The history is provided by the patient.  Cough Associated symptoms: no chills, no ear pain, no eye discharge, no fever, no shortness of breath, no sore throat and no wheezing     Past Medical History:  Diagnosis Date   Anemia    Diarrhea during pregnancy 07/16/2018   Nausea and vomiting of pregnancy, antepartum 03/04/2018   Preterm labor    Sickle cell trait [facility])    Supervision of other normal pregnancy, antepartum 02/25/2018    Nursing Staff Provider Office Location  [facility]  Dating  U/S Language  English Anatomy US  02-27-18 Flu Vaccine   Genetic Screen  NIPS:   AFP:   First Screen:  Quad:   TDaP vaccine   05-27-18 Hgb A1C or  GTT Early  Third trimester  Rhogam     LAB RESULTS  Feeding Plan Breast  Blood Type --/--/O POS, O POS Performed at [facility], [address], [place], [place] [address]  1957)  Contra   Vaginal Pap smear, abnormal     Patient Active Problem List   Diagnosis Date Noted   Pelvic pain 05/28/2022   Premature rupture of membranes 07/23/2018   Group beta Strep positive 07/20/2018   Abdominal pain affecting pregnancy 07/16/2018   Previous cesarean delivery affecting pregnancy    Previous preterm delivery, antepartum, second  trimester    Obesity affecting pregnancy in second trimester     Past Surgical History:  Procedure Laterality Date   CESAREAN SECTION     DILATION AND CURETTAGE OF UTERUS N/A 05/28/2022   Procedure: DILATATION AND CURETTAGE WITH INTRAOPERATIVE ULTRASOUND;  Surgeon: Myna Hidalgo, DO;  Location: [facility] OR;  Service: Gynecology;  Laterality: N/A;   FIBULA FRACTURE SURGERY     TIBIA FRACTURE SURGERY     VULVA SURGERY     Hematoma   WISDOM TOOTH EXTRACTION      OB History     Gravida  5   Para  3   Term  2   Preterm  1   AB  2   Living  3      SAB  2   IAB      Ectopic      Multiple  0   Live Births  1            Home Medications    Prior to Admission medications   Medication Sig Start Date End Date Taking? Authorizing Provider  predniSONE (DELTASONE) 20 MG tablet Take 2 tablets (40 mg total) by mouth daily with breakfast for 5 days. 09/26/22 10/01/22 Yes Tomi Bamberger, PA-C  promethazine-dextromethorphan (PROMETHAZINE-DM) 6.25-15 MG/5ML syrup Take  5 mLs by mouth 4 (four) times daily as needed for cough. 09/26/22  Yes Tomi Bamberger, PA-C  acetaminophen (TYLENOL) 325 MG tablet Take 650 mg by mouth every 6 (six) hours as needed for mild pain or headache. Patient not taking: Reported on 09/26/2022    [provider]  bismuth subsalicylate (PEPTO BISMOL) 262 MG/15ML suspension Take 30 mLs by mouth every 6 (six) hours as needed for indigestion. Patient not taking: Reported on 09/26/2022    [provider]  docusate sodium (COLACE) 100 MG capsule Take 1 capsule (100 mg total) by mouth 2 (two) times daily as needed for mild constipation. Patient not taking: Reported on 09/26/2022 05/30/22   [place] Bing, MD  Elastic Bandages & Supports (COMFORT FIT MATERNITY SUPP SM) MISC Wear as directed. Patient not taking: Reported on 09/05/2018 04/09/18   Brock Bad, MD  Feeding Supplies (BREASTMILK STORAGE BAGS) MISC 1 Bag by Does not apply route  every 3 (three) hours. Patient not taking: Reported on 09/26/2022 09/05/18   Sharyon Cable, CNM  ibuprofen (ADVIL) 600 MG tablet Take 1 tablet (600 mg total) by mouth every 8 (eight) hours as needed. 05/30/22   [place] Bing, MD  montelukast (SINGULAIR) 10 MG tablet Take 10 mg by mouth at bedtime. Patient not taking: Reported on 09/26/2022    [provider]  Prenatal Vit-Fe Fumarate-FA (PRENATAL MULTIVITAMIN) TABS tablet Take 1 tablet by mouth daily at 12 noon. Patient not taking: Reported on 09/26/2022    [provider]  furosemide (LASIX) 20 MG tablet Take 1 tablet (20 mg total) by mouth daily. Patient not taking: Reported on 09/05/2018 07/30/18 03/20/21  Dione Booze, MD    Family History Family History  Problem Relation Age of Onset   Diabetes Mother    Diabetes Brother    Diabetes Maternal Aunt    Kidney disease Maternal Aunt    Diabetes Paternal Grandmother    Dementia Paternal Grandmother     Social History Social History   Tobacco Use   Smoking status: Never   Smokeless tobacco: Never  Vaping Use   Vaping Use: Never used  Substance Use Topics   Alcohol use: Never   Drug use: Never     Allergies   Amoxicillin and Penicillins   Review of Systems Review of Systems  Constitutional:  Negative for chills and fever.  HENT:  Positive for congestion. Negative for ear pain and sore throat.   Eyes:  Negative for discharge and redness.  Respiratory:  Positive for cough. Negative for shortness of breath and wheezing.   Gastrointestinal:  Negative for abdominal pain, diarrhea, nausea and vomiting.     Physical Exam Triage Vital Signs ED Triage Vitals  Enc Vitals Group     BP 09/26/22 1224 129/74     Pulse Rate 09/26/22 1224 78     Resp 09/26/22 1224 19     Temp 09/26/22 1224 98 F (36.7 C)     Temp src --      SpO2 09/26/22 1224 98 %     Weight --      Height --      Head Circumference --      Peak Flow --      Pain Score 09/26/22  1223 0     Pain Loc --      Pain Edu? --      Excl. in GC? --    No data found.  Updated Vital Signs BP 129/74  Pulse 78   Temp 98 F (36.7 C)   Resp 19   LMP 09/19/2022 (Approximate)   SpO2 98%    Physical Exam Vitals and nursing note reviewed.  Constitutional:      General: She is not in acute distress.    Appearance: Normal appearance. She is not ill-appearing.  HENT:     Head: Normocephalic and atraumatic.     Nose: Congestion present.     Mouth/Throat:     Mouth: Mucous membranes are moist.     Pharynx: No oropharyngeal exudate or posterior oropharyngeal erythema.  Eyes:     Conjunctiva/sclera: Conjunctivae normal.  Cardiovascular:     Rate and Rhythm: Normal rate and regular rhythm.     Heart sounds: Normal heart sounds. No murmur heard. Pulmonary:     Effort: Pulmonary effort is normal.     Breath sounds: Normal breath sounds.     Comments: Deep breathing produces significant cough Skin:    General: Skin is warm and dry.  Neurological:     Mental Status: She is alert.  Psychiatric:        Mood and Affect: Mood normal.        Thought Content: Thought content normal.      UC Treatments / Results  Labs (all labs ordered are listed, but only abnormal results are displayed) Labs Reviewed - No data to display  EKG   Radiology DG Chest 2 View  Result Date: 09/25/2022 CLINICAL DATA:  cough EXAM: CHEST - 2 VIEW COMPARISON:  Chest x-ray 07/22/2019, CT chest 07/30/2018 FINDINGS: The heart and mediastinal contours are within normal limits. No focal consolidation. No pulmonary edema. No pleural effusion. No pneumothorax. No acute osseous abnormality. IMPRESSION: No active cardiopulmonary disease. Electronically Signed   By: Tish Frederickson M.D.   On: 09/25/2022 23:08    Procedures Procedures (including critical care time)  Medications Ordered in UC Medications - No data to display  Initial Impression / Assessment and Plan / UC Course  I have reviewed the  triage vital signs and the nursing notes.  Pertinent labs & imaging results that were available during my care of the patient were reviewed by me and considered in my medical decision making (see chart for details).    Steroid burst prescribed to cover bronchitis.  Cough syrup also prescribed to help with symptom relief.  Encouraged follow-up if no gradual improvement or with any further concerns.  Patient expresses understanding.  Final Clinical Impressions(s) / UC Diagnoses   Final diagnoses:  Acute bronchitis, unspecified organism   Discharge Instructions   None    ED Prescriptions     Medication Sig Dispense Auth. Provider   predniSONE (DELTASONE) 20 MG tablet Take 2 tablets (40 mg total) by mouth daily with breakfast for 5 days. 10 tablet Tomi Bamberger, PA-C   promethazine-dextromethorphan (PROMETHAZINE-DM) 6.25-15 MG/5ML syrup Take 5 mLs by mouth 4 (four) times daily as needed for cough. 118 mL Tomi Bamberger, PA-C      PDMP not reviewed this encounter.   Tomi Bamberger, PA-C 09/26/22 1459

## 2022-09-26 NOTE — ED Triage Notes (Signed)
Pt presents to uc with co of cough for 1.5 weeks. Pt was x rayed yesterday at [place] but eloped before being seen. Pt reports tea and steam for treatment.

## 2022-10-03 ENCOUNTER — Encounter: Payer: Self-pay | Admitting: Emergency Medicine

## 2022-10-03 ENCOUNTER — Ambulatory Visit
Admission: EM | Admit: 2022-10-03 | Discharge: 2022-10-03 | Disposition: A | Discharge status: Home / Self Care | Attending: Internal Medicine | Admitting: Internal Medicine

## 2022-10-03 DIAGNOSIS — R053 Chronic cough: Secondary | ICD-10-CM | POA: Diagnosis not present

## 2022-10-03 MED ORDER — GUAIFENESIN 200 MG PO TABS: 200.0000 mg | ORAL_TABLET | ORAL | 0 refills | Status: DC | PRN

## 2022-10-03 MED ORDER — ALBUTEROL SULFATE HFA 108 (90 BASE) MCG/ACT IN AERS: 1.0000 | INHALATION_SPRAY | Freq: Four times a day (QID) | RESPIRATORY_TRACT | 0 refills | Status: AC | PRN

## 2022-10-03 MED ORDER — AZITHROMYCIN 250 MG PO TABS: ORAL_TABLET | ORAL | 0 refills | Status: AC

## 2022-10-03 NOTE — ED Provider Notes (Signed)
[facility]    CSN: [id number] Arrival date & time: 10/03/22  0943      History   Chief Complaint Chief Complaint  Patient presents with   Cough    HPI Christy EllisStefani Leonard [name] is a 36 y.o. female.   Patient presents with persistent cough that has been present for longer than 2 weeks.  Patient was seen on 09/26/2022 and prescribed prednisone and Promethazine DM.  She reports that she saw minimal improvement with the prednisone but cough has been persistent.  Denies any associated fever or upper respiratory symptoms.  Denies chest pain, shortness of breath, nausea, vomiting, diarrhea, abdominal pain.  Patient reports that she does have history of asthma but does not have an albuterol inhaler.   Cough   Past Medical History:  Diagnosis Date   Anemia    Diarrhea during pregnancy 07/16/2018   Nausea and vomiting of pregnancy, antepartum 03/04/2018   Preterm labor    Sickle cell trait [facility](HCC)    Supervision of other normal pregnancy, antepartum 02/25/2018    Nursing Staff Provider Office Location  [facility]  Dating  U/S Language  English Anatomy US  02-27-18 Flu Vaccine   Genetic Screen  NIPS:   AFP:   First Screen:  Quad:   TDaP vaccine   05-27-18 Hgb A1C or  GTT Early  Third trimester  Rhogam     LAB RESULTS  Feeding Plan Breast  Blood Type --/--/O POS, O POS Performed at [facility], [address] [place], [place] [address]  [phone](04/29 1957)  Contra   Vaginal Pap smear, abnormal     Patient Active Problem List   Diagnosis Date Noted   Pelvic pain 05/28/2022   Premature rupture of membranes 07/23/2018   Group beta Strep positive 07/20/2018   Abdominal pain affecting pregnancy 07/16/2018   Previous cesarean delivery affecting pregnancy    Previous preterm delivery, antepartum, second trimester    Obesity affecting pregnancy in second trimester     Past Surgical History:  Procedure Laterality Date   CESAREAN SECTION     DILATION AND CURETTAGE OF UTERUS N/A  05/28/2022   Procedure: DILATATION AND CURETTAGE WITH INTRAOPERATIVE ULTRASOUND;  Surgeon: Myna Hidalgozan, Jennifer, DO;  Location: [facility] OR;  Service: Gynecology;  Laterality: N/A;   FIBULA FRACTURE SURGERY     TIBIA FRACTURE SURGERY     VULVA SURGERY     Hematoma   WISDOM TOOTH EXTRACTION      OB History     Gravida  5   Para  3   Term  2   Preterm  1   AB  2   Living  3      SAB  2   IAB      Ectopic      Multiple  0   Live Births  1            Home Medications    Prior to Admission medications   Medication Sig Start Date End Date Taking? Authorizing Provider  albuterol (VENTOLIN HFA) 108 (90 Base) MCG/ACT inhaler Inhale 1-2 puffs into the lungs every 6 (six) hours as needed for wheezing or shortness of breath. 10/03/22  Yes Janiel Crisostomo, Rolly SalterHaley E, FNP  azithromycin (ZITHROMAX Z-PAK) 250 MG tablet Take 2 tablets (500 mg total) by mouth daily for 1 day, THEN 1 tablet (250 mg total) daily for 4 days. 10/03/22 10/08/22 Yes Zvi Duplantis, Acie FredricksonHaley E, FNP  guaiFENesin 200 MG tablet Take 1 tablet (200 mg total) by  mouth every 4 (four) hours as needed for cough or to loosen phlegm. 10/03/22  Yes Harneet Noblett, Acie Fredrickson, FNP  acetaminophen (TYLENOL) 325 MG tablet Take 650 mg by mouth every 6 (six) hours as needed for mild pain or headache. Patient not taking: Reported on 09/26/2022    [provider]  bismuth subsalicylate (PEPTO BISMOL) 262 MG/15ML suspension Take 30 mLs by mouth every 6 (six) hours as needed for indigestion. Patient not taking: Reported on 09/26/2022    [provider]  docusate sodium (COLACE) 100 MG capsule Take 1 capsule (100 mg total) by mouth 2 (two) times daily as needed for mild constipation. Patient not taking: Reported on 09/26/2022 05/30/22   [place] Bing, MD  Elastic Bandages & Supports (COMFORT FIT MATERNITY SUPP SM) MISC Wear as directed. Patient not taking: Reported on 09/05/2018 04/09/18   Brock Bad, MD  Feeding Supplies (BREASTMILK STORAGE BAGS)  MISC 1 Bag by Does not apply route every 3 (three) hours. Patient not taking: Reported on 09/26/2022 09/05/18   Sharyon Cable, CNM  ibuprofen (ADVIL) 600 MG tablet Take 1 tablet (600 mg total) by mouth every 8 (eight) hours as needed. 05/30/22   [place] Bing, MD  montelukast (SINGULAIR) 10 MG tablet Take 10 mg by mouth at bedtime. Patient not taking: Reported on 09/26/2022    [provider]  Prenatal Vit-Fe Fumarate-FA (PRENATAL MULTIVITAMIN) TABS tablet Take 1 tablet by mouth daily at 12 noon. Patient not taking: Reported on 09/26/2022    [provider]  promethazine-dextromethorphan (PROMETHAZINE-DM) 6.25-15 MG/5ML syrup Take 5 mLs by mouth 4 (four) times daily as needed for cough. 09/26/22   Tomi Bamberger, PA-C  furosemide (LASIX) 20 MG tablet Take 1 tablet (20 mg total) by mouth daily. Patient not taking: Reported on 09/05/2018 07/30/18 03/20/21  Dione Booze, MD    Family History Family History  Problem Relation Age of Onset   Diabetes Mother    Diabetes Brother    Diabetes Maternal Aunt    Kidney disease Maternal Aunt    Diabetes Paternal Grandmother    Dementia Paternal Grandmother     Social History Social History   Tobacco Use   Smoking status: Never   Smokeless tobacco: Never  Vaping Use   Vaping Use: Never used  Substance Use Topics   Alcohol use: Never   Drug use: Never     Allergies   Amoxicillin and Penicillins   Review of Systems Review of Systems Per HPI  Physical Exam Triage Vital Signs ED Triage Vitals [10/03/22 1027]  Enc Vitals Group     BP 120/80     Pulse Rate 69     Resp 18     Temp 98.1 F (36.7 C)     Temp src      SpO2 98 %     Weight      Height      Head Circumference      Peak Flow      Pain Score 0     Pain Loc      Pain Edu?      Excl. in GC?    No data found.  Updated Vital Signs BP 120/80   Pulse 69   Temp 98.1 F (36.7 C)   Resp 18   LMP 09/19/2022 (Approximate)   SpO2 98%    Breastfeeding No   Visual Acuity Right Eye Distance:   Left Eye Distance:   Bilateral Distance:    Right Eye  Near:   Left Eye Near:    Bilateral Near:     Physical Exam Constitutional:      General: She is not in acute distress.    Appearance: Normal appearance. She is not toxic-appearing or diaphoretic.  HENT:     Head: Normocephalic and atraumatic.     Right Ear: Tympanic membrane and ear canal normal.     Left Ear: Tympanic membrane and ear canal normal.     Nose: Congestion present.     Mouth/Throat:     Mouth: Mucous membranes are moist.     Pharynx: No posterior oropharyngeal erythema.  Eyes:     Extraocular Movements: Extraocular movements intact.     Conjunctiva/sclera: Conjunctivae normal.     Pupils: Pupils are equal, round, and reactive to light.  Cardiovascular:     Rate and Rhythm: Normal rate and regular rhythm.     Pulses: Normal pulses.     Heart sounds: Normal heart sounds.  Pulmonary:     Effort: Pulmonary effort is normal. No respiratory distress.     Breath sounds: Normal breath sounds. No stridor. No wheezing, rhonchi or rales.     Comments: Harsh cough noted on exam.  Abdominal:     General: Abdomen is flat. Bowel sounds are normal.     Palpations: Abdomen is soft.  Musculoskeletal:        General: Normal range of motion.     Cervical back: Normal range of motion.  Skin:    General: Skin is warm and dry.  Neurological:     General: No focal deficit present.     Mental Status: She is alert and oriented to person, place, and time. Mental status is at baseline.  Psychiatric:        Mood and Affect: Mood normal.        Behavior: Behavior normal.      UC Treatments / Results  Labs (all labs ordered are listed, but only abnormal results are displayed) Labs Reviewed - No data to display  EKG   Radiology No results found.  Procedures Procedures (including critical care time)  Medications Ordered in UC Medications - No data to  display  Initial Impression / Assessment and Plan / UC Course  I have reviewed the triage vital signs and the nursing notes.  Pertinent labs & imaging results that were available during my care of the patient were reviewed by me and considered in my medical decision making (see chart for details).     Patient has persistent cough for more than 2 weeks.  She has been treated with prednisone and Promethazine DM with minimal improvement.  There are no adventitious lung sounds on exam and vital signs are stable so do not think that chest imaging is necessary.  Given duration of symptoms, there is concern for secondary bacterial infection so will treat with azithromycin to cover for atypicals.  Guaifenesin prescribed to take as needed for cough as well.  Also prescribed albuterol inhaler given history of asthma and persistent cough.  Patient advised to follow-up if symptoms persist or worsen.  Patient verbalized understanding and was agreeable with plan. Final Clinical Impressions(s) / UC Diagnoses   Final diagnoses:  Persistent cough     Discharge Instructions      I am prescribing you an antibiotic, an inhaler, and a different cough medication to help alleviate your symptoms.  Please follow-up if symptoms persist or worsen.    ED Prescriptions     Medication  Sig Dispense Auth. Provider   azithromycin (ZITHROMAX Z-PAK) 250 MG tablet Take 2 tablets (500 mg total) by mouth daily for 1 day, THEN 1 tablet (250 mg total) daily for 4 days. 6 tablet [place], [place], [place]   guaiFENesin 200 MG tablet Take 1 tablet (200 mg total) by mouth every 4 (four) hours as needed for cough or to loosen phlegm. 30 suppository Montine Hight, [place], [place]   albuterol (VENTOLIN HFA) 108 (90 Base) MCG/ACT inhaler Inhale 1-2 puffs into the lungs every 6 (six) hours as needed for wheezing or shortness of breath. 1 each Gustavus Bryant, [place]      PDMP not reviewed this encounter.   Gustavus Bryant, [place] 10/03/22 1118

## 2022-10-03 NOTE — ED Triage Notes (Signed)
Pt is present today with a persistent cough x2 weeks. Pt states that the medication given at her last visit did not help.

## 2022-10-03 NOTE — Discharge Instructions (Signed)
I am prescribing you an antibiotic, an inhaler, and a different cough medication to help alleviate your symptoms.  Please follow-up if symptoms persist or worsen.

## 2023-07-11 ENCOUNTER — Ambulatory Visit
Admission: EM | Admit: 2023-07-11 | Discharge: 2023-07-11 | Disposition: A | Discharge status: Home / Self Care | Attending: Physician Assistant | Admitting: Physician Assistant

## 2023-07-11 DIAGNOSIS — G43909 Migraine, unspecified, not intractable, without status migrainosus: Secondary | ICD-10-CM | POA: Diagnosis not present

## 2023-07-11 MED ORDER — KETOROLAC TROMETHAMINE 30 MG/ML IJ SOLN
30.0000 mg | Freq: Once | INTRAMUSCULAR | Status: AC
  Administered 2023-07-11: 30 mg via INTRAMUSCULAR

## 2023-07-11 NOTE — ED Triage Notes (Signed)
"  I am having recurrent Migraines". Followed by Neurology for this.

## 2023-07-11 NOTE — ED Notes (Signed)
 + No Reaction to Toradol. No Concerns.   B. Roten CMA

## 2023-07-11 NOTE — ED Provider Notes (Signed)
[facility]    CSN: [id number] Arrival date & time: 07/11/23  1127      History   Chief Complaint Chief Complaint  Patient presents with   Headache    HPI Analys Christy [name] is a 37 y.o. female.   Patient here today for evaluation of recurrent migraines for the last several days.  She states that she has had some photosensitivity but denies any nausea or vomiting.  She states she feels dazed but this is common with her migraines.  She denies any changes or differences in this migraine compared to her prior.  She states with this migraine her pain is mostly to her right frontal area.  She denies any numbness or tingling.  She has tried ibuprofen and Tylenol at home.  Last dose of ibuprofen around 7 hours ago.  The history is provided by the patient.  Headache Associated symptoms: photophobia   Associated symptoms: no fever, no nausea, no numbness, no vomiting and no weakness     Past Medical History:  Diagnosis Date   Anemia    Diarrhea during pregnancy 07/16/2018   Nausea and vomiting of pregnancy, antepartum 03/04/2018   Preterm labor    Sickle cell trait [facility])    Supervision of other normal pregnancy, antepartum 02/25/2018    Nursing Staff Provider Office Location  [facility]  Dating  U/S Language  English Anatomy US  02-27-18 Flu Vaccine   Genetic Screen  NIPS:   AFP:   First Screen:  Quad:   TDaP vaccine   05-27-18 Hgb A1C or  GTT Early  Third trimester  Rhogam     LAB RESULTS  Feeding Plan Breast  Blood Type --/--/O POS, O POS Performed at [facility], [address], [place], [place] [address]  [phone]/29 1957)  Contra   Vaginal Pap smear, abnormal     Patient Active Problem List   Diagnosis Date Noted   Pelvic pain 05/28/2022   Premature rupture of membranes 07/23/2018   Group beta Strep positive 07/20/2018   Abdominal pain affecting pregnancy 07/16/2018   Previous cesarean delivery affecting pregnancy    Previous preterm delivery, antepartum, second  trimester    Obesity affecting pregnancy in second trimester     Past Surgical History:  Procedure Laterality Date   CESAREAN SECTION     DILATION AND CURETTAGE OF UTERUS N/A 05/28/2022   Procedure: DILATATION AND CURETTAGE WITH INTRAOPERATIVE ULTRASOUND;  Surgeon: Myna Hidalgo, DO;  Location: [facility] OR;  Service: Gynecology;  Laterality: N/A;   FIBULA FRACTURE SURGERY     TIBIA FRACTURE SURGERY     VULVA SURGERY     Hematoma   WISDOM TOOTH EXTRACTION      OB History     Gravida  5   Para  3   Term  2   Preterm  1   AB  2   Living  3      SAB  2   IAB      Ectopic      Multiple  0   Live Births  1            Home Medications    Prior to Admission medications   Medication Sig Start Date End Date Taking? Authorizing Provider  ibuprofen (ADVIL) 600 MG tablet Take 1 tablet (600 mg total) by mouth every 8 (eight) hours as needed. 05/30/22  Yes Kelly Bing, MD  acetaminophen (TYLENOL) 325 MG tablet Take 650 mg by mouth every 6 (six)  hours as needed for mild pain or headache. Patient not taking: Reported on 09/26/2022    [provider]  albuterol (VENTOLIN HFA) 108 (90 Base) MCG/ACT inhaler Inhale 1-2 puffs into the lungs every 6 (six) hours as needed for wheezing or shortness of breath. 10/03/22   Gustavus Bryant, FNP  bismuth subsalicylate (PEPTO BISMOL) 262 MG/15ML suspension Take 30 mLs by mouth every 6 (six) hours as needed for indigestion. Patient not taking: Reported on 09/26/2022    [provider]  docusate sodium (COLACE) 100 MG capsule Take 1 capsule (100 mg total) by mouth 2 (two) times daily as needed for mild constipation. Patient not taking: Reported on 09/26/2022 05/30/22   [place] Bing, MD  Elastic Bandages & Supports (COMFORT FIT MATERNITY SUPP SM) MISC Wear as directed. Patient not taking: Reported on 09/05/2018 04/09/18   Brock Bad, MD  Feeding Supplies (BREASTMILK STORAGE BAGS) MISC 1 Bag by Does not apply route  every 3 (three) hours. Patient not taking: Reported on 09/26/2022 09/05/18   Sharyon Cable, CNM  guaiFENesin 200 MG tablet Take 1 tablet (200 mg total) by mouth every 4 (four) hours as needed for cough or to loosen phlegm. 10/03/22   Gustavus Bryant, FNP  montelukast (SINGULAIR) 10 MG tablet Take 10 mg by mouth at bedtime. Patient not taking: Reported on 09/26/2022    [provider]  Prenatal Vit-Fe Fumarate-FA (PRENATAL MULTIVITAMIN) TABS tablet Take 1 tablet by mouth daily at 12 noon. Patient not taking: Reported on 09/26/2022    [provider]  promethazine-dextromethorphan (PROMETHAZINE-DM) 6.25-15 MG/5ML syrup Take 5 mLs by mouth 4 (four) times daily as needed for cough. 09/26/22   Tomi Bamberger, PA-C  furosemide (LASIX) 20 MG tablet Take 1 tablet (20 mg total) by mouth daily. Patient not taking: Reported on 09/05/2018 07/30/18 03/20/21  Dione Booze, MD    Family History Family History  Problem Relation Age of Onset   Diabetes Mother    Diabetes Brother    Diabetes Maternal Aunt    Kidney disease Maternal Aunt    Diabetes Paternal Grandmother    Dementia Paternal Grandmother     Social History Social History   Tobacco Use   Smoking status: Never   Smokeless tobacco: Never  Vaping Use   Vaping status: Never Used  Substance Use Topics   Alcohol use: Never   Drug use: Never     Allergies   Soybean oil, Amoxicillin, Penicillin v potassium, and Penicillins   Review of Systems Review of Systems  Constitutional:  Negative for chills and fever.  Eyes:  Positive for photophobia. Negative for discharge and redness.  Respiratory:  Negative for shortness of breath.   Gastrointestinal:  Negative for nausea and vomiting.  Neurological:  Positive for headaches. Negative for weakness and numbness.     Physical Exam Triage Vital Signs ED Triage Vitals  Encounter Vitals Group     BP 07/11/23 1200 113/69     Systolic BP Percentile --      Diastolic  BP Percentile --      Pulse Rate 07/11/23 1200 60     Resp 07/11/23 1200 18     Temp 07/11/23 1200 (!) 97.4 F (36.3 C)     Temp Source 07/11/23 1200 Oral     SpO2 07/11/23 1200 97 %     Weight 07/11/23 1158 157 lb (71.2 kg)     Height 07/11/23 1158 5\' 5"  (1.651 m)     Head  Circumference --      Peak Flow --      Pain Score 07/11/23 1151 0     Pain Loc --      Pain Education --      Exclude from Growth Chart --    No data found.  Updated Vital Signs BP 113/69 (BP Location: Left Arm)   Pulse 60   Temp (!) 97.4 F (36.3 C) (Oral)   Resp 18   Ht 5\' 5"  (1.651 m)   Wt 157 lb (71.2 kg)   LMP 06/21/2023 (Exact Date)   SpO2 97%   BMI 26.13 kg/m      Physical Exam Vitals and nursing note reviewed.  Constitutional:      General: She is not in acute distress.    Appearance: She is well-developed. She is not ill-appearing.  HENT:     Head: Normocephalic and atraumatic.     Nose: Nose normal. No congestion or rhinorrhea.  Eyes:     Extraocular Movements: Extraocular movements intact.     Conjunctiva/sclera: Conjunctivae normal.     Pupils: Pupils are equal, round, and reactive to light.  Cardiovascular:     Rate and Rhythm: Normal rate.  Pulmonary:     Effort: Pulmonary effort is normal. No respiratory distress.  Neurological:     Mental Status: She is alert.     Comments: No facial droop, normal speech, normal finger-to-nose      UC Treatments / Results  Labs (all labs ordered are listed, but only abnormal results are displayed) Labs Reviewed - No data to display  EKG   Radiology No results found.  Procedures Procedures (including critical care time)  Medications Ordered in UC Medications  ketorolac (TORADOL) 30 MG/ML injection 30 mg (30 mg Intramuscular Given 07/11/23 1258)    Initial Impression / Assessment and Plan / UC Course  I have reviewed the triage vital signs and the nursing notes.  Pertinent labs & imaging results that were available during my  care of the patient were reviewed by me and considered in my medical decision making (see chart for details).    Toradol injection administered in office to hopefully break migraine cycle and recommended follow-up in ED with any persistent or worsening symptoms.  Advise no NSAIDs remainder of day.  Encouraged follow-up with any further concerns.  Final Clinical Impressions(s) / UC Diagnoses   Final diagnoses:  Migraine without status migrainosus, not intractable, unspecified migraine type   Discharge Instructions   None    ED Prescriptions   None    PDMP not reviewed this encounter.   Tomi Bamberger, PA-C 07/11/23 [id number]

## 2023-11-29 ENCOUNTER — Ambulatory Visit: Admission: EM | Admit: 2023-11-29 | Discharge: 2023-11-29 | Disposition: A | Discharge status: Home / Self Care

## 2023-11-29 DIAGNOSIS — J069 Acute upper respiratory infection, unspecified: Secondary | ICD-10-CM

## 2023-11-29 DIAGNOSIS — Z01818 Encounter for other preprocedural examination: Secondary | ICD-10-CM | POA: Insufficient documentation

## 2023-11-29 DIAGNOSIS — S76019A Strain of muscle, fascia and tendon of unspecified hip, initial encounter: Secondary | ICD-10-CM | POA: Insufficient documentation

## 2023-11-29 DIAGNOSIS — M62830 Muscle spasm of back: Secondary | ICD-10-CM | POA: Insufficient documentation

## 2023-11-29 DIAGNOSIS — Z713 Dietary counseling and surveillance: Secondary | ICD-10-CM | POA: Insufficient documentation

## 2023-11-29 DIAGNOSIS — M79609 Pain in unspecified limb: Secondary | ICD-10-CM | POA: Insufficient documentation

## 2023-11-29 DIAGNOSIS — H53029 Refractive amblyopia, unspecified eye: Secondary | ICD-10-CM | POA: Insufficient documentation

## 2023-11-29 DIAGNOSIS — K529 Noninfective gastroenteritis and colitis, unspecified: Secondary | ICD-10-CM | POA: Insufficient documentation

## 2023-11-29 DIAGNOSIS — H5231 Anisometropia: Secondary | ICD-10-CM | POA: Insufficient documentation

## 2023-11-29 DIAGNOSIS — M25579 Pain in unspecified ankle and joints of unspecified foot: Secondary | ICD-10-CM | POA: Insufficient documentation

## 2023-11-29 DIAGNOSIS — M25569 Pain in unspecified knee: Secondary | ICD-10-CM | POA: Insufficient documentation

## 2023-11-29 DIAGNOSIS — Z0100 Encounter for examination of eyes and vision without abnormal findings: Secondary | ICD-10-CM | POA: Insufficient documentation

## 2023-11-29 DIAGNOSIS — Z760 Encounter for issue of repeat prescription: Secondary | ICD-10-CM | POA: Insufficient documentation

## 2023-11-29 DIAGNOSIS — D573 Sickle-cell trait: Secondary | ICD-10-CM | POA: Insufficient documentation

## 2023-11-29 DIAGNOSIS — R059 Cough, unspecified: Secondary | ICD-10-CM | POA: Insufficient documentation

## 2023-11-29 DIAGNOSIS — T148XXA Other injury of unspecified body region, initial encounter: Secondary | ICD-10-CM | POA: Insufficient documentation

## 2023-11-29 DIAGNOSIS — Z7189 Other specified counseling: Secondary | ICD-10-CM | POA: Insufficient documentation

## 2023-11-29 DIAGNOSIS — Z0289 Encounter for other administrative examinations: Secondary | ICD-10-CM | POA: Insufficient documentation

## 2023-11-29 DIAGNOSIS — M545 Low back pain, unspecified: Secondary | ICD-10-CM | POA: Insufficient documentation

## 2023-11-29 DIAGNOSIS — Z3009 Encounter for other general counseling and advice on contraception: Secondary | ICD-10-CM

## 2023-11-29 DIAGNOSIS — D509 Iron deficiency anemia, unspecified: Secondary | ICD-10-CM | POA: Insufficient documentation

## 2023-11-29 DIAGNOSIS — Z0189 Encounter for other specified special examinations: Secondary | ICD-10-CM | POA: Insufficient documentation

## 2023-11-29 DIAGNOSIS — R0981 Nasal congestion: Secondary | ICD-10-CM | POA: Insufficient documentation

## 2023-11-29 DIAGNOSIS — Y9366 Activity, soccer: Secondary | ICD-10-CM | POA: Insufficient documentation

## 2023-11-29 DIAGNOSIS — Z23 Encounter for immunization: Secondary | ICD-10-CM | POA: Insufficient documentation

## 2023-11-29 DIAGNOSIS — G43909 Migraine, unspecified, not intractable, without status migrainosus: Secondary | ICD-10-CM | POA: Insufficient documentation

## 2023-11-29 DIAGNOSIS — D649 Anemia, unspecified: Secondary | ICD-10-CM | POA: Insufficient documentation

## 2023-11-29 DIAGNOSIS — R109 Unspecified abdominal pain: Secondary | ICD-10-CM | POA: Insufficient documentation

## 2023-11-29 DIAGNOSIS — G4700 Insomnia, unspecified: Secondary | ICD-10-CM | POA: Insufficient documentation

## 2023-11-29 DIAGNOSIS — O906 Postpartum mood disturbance: Secondary | ICD-10-CM | POA: Insufficient documentation

## 2023-11-29 DIAGNOSIS — S86919A Strain of unspecified muscle(s) and tendon(s) at lower leg level, unspecified leg, initial encounter: Secondary | ICD-10-CM | POA: Insufficient documentation

## 2023-11-29 DIAGNOSIS — I517 Cardiomegaly: Secondary | ICD-10-CM | POA: Insufficient documentation

## 2023-11-29 DIAGNOSIS — Z111 Encounter for screening for respiratory tuberculosis: Secondary | ICD-10-CM | POA: Insufficient documentation

## 2023-11-29 DIAGNOSIS — H5210 Myopia, unspecified eye: Secondary | ICD-10-CM | POA: Insufficient documentation

## 2023-11-29 DIAGNOSIS — N920 Excessive and frequent menstruation with regular cycle: Secondary | ICD-10-CM | POA: Insufficient documentation

## 2023-11-29 DIAGNOSIS — J329 Chronic sinusitis, unspecified: Secondary | ICD-10-CM | POA: Insufficient documentation

## 2023-11-29 DIAGNOSIS — Z0000 Encounter for general adult medical examination without abnormal findings: Secondary | ICD-10-CM | POA: Insufficient documentation

## 2023-11-29 DIAGNOSIS — Z025 Encounter for examination for participation in sport: Secondary | ICD-10-CM | POA: Insufficient documentation

## 2023-11-29 DIAGNOSIS — H5200 Hypermetropia, unspecified eye: Secondary | ICD-10-CM | POA: Insufficient documentation

## 2023-11-29 DIAGNOSIS — M549 Dorsalgia, unspecified: Secondary | ICD-10-CM | POA: Insufficient documentation

## 2023-11-29 DIAGNOSIS — J309 Allergic rhinitis, unspecified: Secondary | ICD-10-CM | POA: Insufficient documentation

## 2023-11-29 HISTORY — DX: Encounter for other general counseling and advice on contraception: Z30.09

## 2023-11-29 LAB — POC COVID19/FLU A&B COMBO
Covid Antigen, POC: NEGATIVE
Influenza A Antigen, POC: NEGATIVE
Influenza B Antigen, POC: NEGATIVE

## 2023-11-29 NOTE — ED Provider Notes (Signed)
[facility]    CSN: [id number] Arrival date & time: 11/29/23  1258      History   Chief Complaint Chief Complaint  Patient presents with   Nausea   Cough    HPI Christy [name] is a 38 y.o. female.   Patient here today for evaluation of cough, nausea, fever, chills and headache that started 3 days ago.  She has not had any vomiting or diarrhea.  She has not had sore throat or bodyaches.  She has tried DayQuil NyQuil and Pepto-Bismol at home without resolution.  She has not checked her temperature at home.  The history is provided by the patient.  Cough Associated symptoms: chills and fever (subjective)   Associated symptoms: no ear pain, no eye discharge, no shortness of breath, no sore throat and no wheezing     Past Medical History:  Diagnosis Date   Anemia    Counseling for birth control, oral contraceptives 11/29/2023   Diarrhea during pregnancy 07/16/2018   Nausea and vomiting of pregnancy, antepartum 03/04/2018   Preterm labor    Sickle cell trait [facility])    Supervision of other normal pregnancy, antepartum 02/25/2018    Nursing Staff Provider Office Location  [facility]  Dating  U/S Language  English Anatomy US  02-27-18 Flu Vaccine   Genetic Screen  NIPS:   AFP:   First Screen:  Quad:   TDaP vaccine   05-27-18 Hgb A1C or  GTT Early  Third trimester  Rhogam     LAB RESULTS  Feeding Plan Breast  Blood Type --/--/O POS, O POS Performed at [facility], [address] [place], [place] [address]  [phone] 1957)  Contra   Vaginal Pap smear, abnormal     Patient Active Problem List   Diagnosis Date Noted   Counseling for birth control, oral contraceptives 11/29/2023   Hip strain 11/29/2023   Muscle strain of lower leg 11/29/2023   Physical therapy evaluation, initial 11/29/2023   Muscle strain 11/29/2023   Sports physical 11/29/2023   Encounter for annual physical exam 11/29/2023   Activities involving soccer 11/29/2023   Allergic rhinitis 11/29/2023    Anemia 11/29/2023   Cardiomegaly 11/29/2023   Cough 11/29/2023   Encounter for issue of repeat prescription 11/29/2023   Excessive or frequent menstruation 11/29/2023   Gastroenteritis 11/29/2023   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   Myopia 11/29/2023   Insomnia 11/29/2023   Iron deficiency anemia 11/29/2023   Migraine headache 11/29/2023   Nasal congestion 11/29/2023   Need for prophylactic vaccination with tetanus-diphtheria (Td) 11/29/2023   Need for varicella vaccine 11/29/2023   Need for prophylactic vaccination and inoculation against viral hepatitis 11/29/2023   Need for prophylactic vaccination with measles-mumps-rubella (MMR) vaccine 11/29/2023   Need for prophylactic vaccination and inoculation against poliomyelitis 11/29/2023   Pain in joint, ankle and foot 11/29/2023   Pain in joint, lower leg 11/29/2023   Pain in soft tissues of limb 11/29/2023   Postpartum mood disturbance 11/29/2023   Pre-operative examination 11/29/2023   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/29/2023   Screening examination for pulmonary tuberculosis 11/29/2023   Sickle cell trait (HCC) 11/29/2023   Sinusitis 11/29/2023   Health examination of defined subpopulation 11/29/2023   Examination of eyes and vision 11/29/2023   Counseling on injury prevention 11/29/2023   Other specified counseling 11/29/2023   Dietary counseling and surveillance 11/29/2023   Abdominal pain 11/29/2023   Back muscle spasm 11/29/2023   Backache 11/29/2023   Lower back  pain 11/29/2023   Low back pain 11/29/2023   Pelvic pain 05/28/2022   Premature rupture of membranes 07/23/2018   Group beta Strep positive 07/20/2018   Abdominal pain affecting pregnancy 07/16/2018   Previous cesarean delivery affecting pregnancy    Previous preterm delivery, antepartum, second trimester    Obesity affecting pregnancy in second trimester    Encounter for other administrative examinations 12/08/2015    Past Surgical History:   Procedure Laterality Date   CESAREAN SECTION     DILATION AND CURETTAGE OF UTERUS N/A 05/28/2022   Procedure: DILATATION AND CURETTAGE WITH INTRAOPERATIVE ULTRASOUND;  Surgeon: Myna Hidalgo, DO;  Location: [facility] OR;  Service: Gynecology;  Laterality: N/A;   FIBULA FRACTURE SURGERY     TIBIA FRACTURE SURGERY     VULVA SURGERY     Hematoma   WISDOM TOOTH EXTRACTION      OB History     Gravida  5   Para  3   Term  2   Preterm  1   AB  2   Living  3      SAB  2   IAB      Ectopic      Multiple  0   Live Births  1            Home Medications    Prior to Admission medications   Medication Sig Start Date End Date Taking? Authorizing Provider  albuterol (VENTOLIN HFA) 108 (90 Base) MCG/ACT inhaler Inhale 1-2 puffs into the lungs every 6 (six) hours as needed for wheezing or shortness of breath. 10/03/22  Yes Mound, Rolly Salter E, FNP  cyclobenzaprine (FLEXERIL) 10 MG tablet Take 10 mg by mouth as directed. 03/25/23  Yes [provider]  guaiFENesin 200 MG tablet Take 1 tablet (200 mg total) by mouth every 4 (four) hours as needed for cough or to loosen phlegm. 10/03/22  Yes Mound, Rolly Salter E, FNP  ibuprofen (ADVIL) 400 MG tablet Take 400 mg by mouth as directed. 03/25/23  Yes [provider]  acetaminophen (TYLENOL) 325 MG tablet Take 650 mg by mouth every 6 (six) hours as needed for mild pain or headache. Patient not taking: Reported on 09/26/2022    [provider]  acetaminophen (TYLENOL) 500 MG tablet Take 500 mg by mouth every 6 (six) hours as needed. 03/25/23   [provider]  bismuth subsalicylate (PEPTO BISMOL) 262 MG/15ML suspension Take 30 mLs by mouth every 6 (six) hours as needed for indigestion. Patient not taking: Reported on 09/26/2022    [provider]  docusate sodium (COLACE) 100 MG capsule Take 1 capsule (100 mg total) by mouth 2 (two) times daily as needed for mild constipation. Patient not taking: Reported on  09/26/2022 05/30/22   Lankin Bing, MD  Elastic Bandages & Supports (COMFORT FIT MATERNITY SUPP SM) MISC Wear as directed. Patient not taking: Reported on 09/05/2018 04/09/18   Brock Bad, MD  Feeding Supplies (BREASTMILK STORAGE BAGS) MISC 1 Bag by Does not apply route every 3 (three) hours. Patient not taking: Reported on 09/26/2022 09/05/18   Sharyon Cable, CNM  ibuprofen (ADVIL) 600 MG tablet Take 1 tablet (600 mg total) by mouth every 8 (eight) hours as needed. 05/30/22   Muddy Bing, MD  montelukast (SINGULAIR) 10 MG tablet Take 10 mg by mouth at bedtime. Patient not taking: Reported on 09/26/2022    [provider]  Prenatal Vit-Fe Fumarate-FA (PRENATAL MULTIVITAMIN) TABS tablet Take 1 tablet by mouth  daily at 12 noon. Patient not taking: Reported on 09/26/2022    [provider]  promethazine-dextromethorphan (PROMETHAZINE-DM) 6.25-15 MG/5ML syrup Take 5 mLs by mouth 4 (four) times daily as needed for cough. Patient not taking: Reported on 11/29/2023 09/26/22   Tomi Bamberger, PA-C  furosemide (LASIX) 20 MG tablet Take 1 tablet (20 mg total) by mouth daily. Patient not taking: Reported on 09/05/2018 07/30/18 03/20/21  Dione Booze, MD    Family History Family History  Problem Relation Age of Onset   Diabetes Mother    Diabetes Brother    Diabetes Maternal Aunt    Kidney disease Maternal Aunt    Diabetes Paternal Grandmother    Dementia Paternal Grandmother     Social History Social History   Tobacco Use   Smoking status: Never   Smokeless tobacco: Never  Vaping Use   Vaping status: Never Used  Substance Use Topics   Alcohol use: Never   Drug use: Never     Allergies   Soybean oil, Amoxicillin, Penicillin v potassium, and Penicillins   Review of Systems Review of Systems  Constitutional:  Positive for chills and fever (subjective).  HENT:  Positive for congestion. Negative for ear pain and sore throat.   Eyes:  Negative for  discharge and redness.  Respiratory:  Positive for cough. Negative for shortness of breath and wheezing.   Gastrointestinal:  Negative for abdominal pain, diarrhea, nausea and vomiting.     Physical Exam Triage Vital Signs ED Triage Vitals [11/29/23 1333]  Encounter Vitals Group     BP 115/77     Systolic BP Percentile      Diastolic BP Percentile      Pulse Rate 78     Resp 16     Temp 97.8 F (36.6 C)     Temp Source Oral     SpO2 98 %     Weight      Height      Head Circumference      Peak Flow      Pain Score 2     Pain Loc      Pain Education      Exclude from Growth Chart    No data found.  Updated Vital Signs BP 115/77 (BP Location: Left Arm)   Pulse 78   Temp 97.8 F (36.6 C) (Oral)   Resp 16   LMP 10/27/2023 (Approximate)   SpO2 98%     Physical Exam Vitals and nursing note reviewed.  Constitutional:      General: She is not in acute distress.    Appearance: Normal appearance. She is not ill-appearing.  HENT:     Head: Normocephalic and atraumatic.     Right Ear: Tympanic membrane normal.     Left Ear: Tympanic membrane normal.     Nose: Congestion present.     Mouth/Throat:     Mouth: Mucous membranes are moist.     Pharynx: No oropharyngeal exudate or posterior oropharyngeal erythema.  Eyes:     Conjunctiva/sclera: Conjunctivae normal.  Cardiovascular:     Rate and Rhythm: Normal rate and regular rhythm.     Heart sounds: Normal heart sounds. No murmur heard. Pulmonary:     Effort: Pulmonary effort is normal. No respiratory distress.     Breath sounds: Normal breath sounds. No wheezing, rhonchi or rales.  Skin:    General: Skin is warm and dry.  Neurological:     Mental Status: She is alert.  Psychiatric:        Mood and Affect: Mood normal.        Thought Content: Thought content normal.      UC Treatments / Results  Labs (all labs ordered are listed, but only abnormal results are displayed) Labs Reviewed  POC COVID19/FLU A&B  COMBO - Normal    EKG   Radiology No results found.  Procedures Procedures (including critical care time)  Medications Ordered in UC Medications - No data to display  Initial Impression / Assessment and Plan / UC Course  I have reviewed the triage vital signs and the nursing notes.  Pertinent labs & imaging results that were available during my care of the patient were reviewed by me and considered in my medical decision making (see chart for details).    Suspect viral etiology of illness.  COVID and flu screening negative.  Advised follow-up if no gradual improvement with continued symptomatic treatment, increase fluids and rest.  Patient expresses understanding.  Final Clinical Impressions(s) / UC Diagnoses   Final diagnoses:  Acute upper respiratory infection   Discharge Instructions   None    ED Prescriptions   None    PDMP not reviewed this encounter.   Tomi Bamberger, PA-C 11/29/23 1640

## 2023-11-29 NOTE — ED Triage Notes (Signed)
Pt reports cough, nausea, fever, chills, and headache x3 days. Denies sore throat, vomiting, diarrhea, body aches. Pt reports no relief with dayquil/nyquil or pepto bismol at home. Unknown max temp, no thermometer at home. No at home testing completed.

## 2024-03-13 ENCOUNTER — Encounter: Payer: Self-pay | Admitting: Emergency Medicine

## 2024-03-13 ENCOUNTER — Ambulatory Visit
Admission: EM | Admit: 2024-03-13 | Discharge: 2024-03-13 | Disposition: A | Discharge status: Home / Self Care | Attending: Family Medicine | Admitting: Family Medicine

## 2024-03-13 DIAGNOSIS — T7840XA Allergy, unspecified, initial encounter: Secondary | ICD-10-CM | POA: Diagnosis not present

## 2024-03-13 DIAGNOSIS — R1083 Colic: Secondary | ICD-10-CM | POA: Insufficient documentation

## 2024-03-13 MED ORDER — DEXAMETHASONE SODIUM PHOSPHATE 10 MG/ML IJ SOLN
10.0000 mg | Freq: Once | INTRAMUSCULAR | Status: AC
  Administered 2024-03-13: 10 mg via INTRAMUSCULAR

## 2024-03-13 MED ORDER — PREDNISONE 20 MG PO TABS: 40.0000 mg | ORAL_TABLET | Freq: Every day | ORAL | 0 refills | Status: DC

## 2024-03-13 NOTE — ED Triage Notes (Addendum)
 Pt presents having an allergic reaction to something she ate. She reports swelling in face and neck area as well as right side of throat. Pt has taken Benadryl  last night and OTC allergy meds this morning around 6 am.

## 2024-03-13 NOTE — Discharge Instructions (Signed)
 Meds ordered this encounter  Medications   predniSONE  (DELTASONE ) 20 MG tablet    Sig: Take 2 tablets (40 mg total) by mouth daily.    Dispense:  10 tablet    Refill:  0   dexamethasone  (DECADRON ) injection 10 mg

## 2024-03-13 NOTE — ED Provider Notes (Signed)
 [facility]   [id number] 03/13/24 Arrival Time: 0818  ASSESSMENT & PLAN:  1. Allergic reaction, initial encounter    Without resp distress or swallowing difficulties. Unclear trigger.  Meds ordered this encounter  Medications   predniSONE  (DELTASONE ) 20 MG tablet    Sig: Take 2 tablets (40 mg total) by mouth daily.    Dispense:  10 tablet    Refill:  0   dexamethasone  (DECADRON ) injection 10 mg    Follow-up Information     [facility] at [facility].   Specialty: Emergency Medicine Why: If symptoms worsen in any way. Contact information: [address] [place]  [address] [phone]        Schedule an appointment as soon as possible for a visit  with Jonathon Neighbors, MD.   Specialty: Family Medicine Why: For follow up. Contact information: [address] [place] [address] [phone]                 Reviewed expectations re: course of current medical issues. Questions answered. Outlined signs and symptoms indicating need for more acute intervention. Patient verbalized understanding. After Visit Summary given.   SUBJECTIVE: History from: patient. Christy [name] is a 38 y.o. female who presents for evaluation of a possible allergic reaction. Possible trigger: have not been identified. Reports throat tightness, itching, facial swelling, and lip swelling. Onset yest evening. Denies swallowing difficulties and is tolerating PO fluids without problem. Patient reports similar previous allergic reactions. Patient denies exposure to new medications or allergens. Benadryl  yest evening; thinks some help. No tx PTA today.   OBJECTIVE:  Vitals:   03/13/24 0832  BP: 125/85  Pulse: 62  Resp: 16  Temp: 98.3 F (36.8 C)  TempSrc: Oral  SpO2: 99%  Weight: 71.2 kg    General appearance: alert; no distress Eyes: PERRLA; EOMI; conjunctiva normal HENT: normocephalic; atraumatic; mild  swelling of R cheek and upper lip; oral mucosa normal; throat appears normal Neck: supple; FROM; without swelling Lungs: unlabored respirations; speaks full sentences without difficulty Heart: regular Extremities: no cyanosis or edema; symmetrical with no gross deformities Skin: warm and dry    Allergies  Allergen Reactions   Soybean Oil Anaphylaxis    Other Reaction(s): Throat irritation, Throat swelling, Throat irritation, Throat swelling   Amoxicillin Hives    Other Reaction(s): Vomiting   Penicillin V Potassium     Other Reaction(s): Unknown   Penicillins Hives and Nausea And Vomiting    Has patient had a PCN reaction causing immediate rash, facial/tongue/throat swelling, SOB or lightheadedness with hypotension: Yes, no anaphylaxis reaction.  Has patient had a PCN reaction causing severe rash involving mucus membranes or skin necrosis: Yes Has patient had a PCN reaction that required hospitalization: No Has patient had a PCN reaction occurring within the last 10 years: Yes If all of the above answers are "NO", then may proceed with Cephalosporin use.     Past Medical History:  Diagnosis Date   Anemia    Counseling for birth control, oral contraceptives 11/29/2023   Diarrhea during pregnancy 07/16/2018   Nausea and vomiting of pregnancy, antepartum 03/04/2018   Preterm labor    Sickle cell trait [facility])    Supervision of other normal pregnancy, antepartum 02/25/2018    Nursing Staff Provider Office Location  [facility]  Dating  U/S Language  English Anatomy US   02-27-18 Flu Vaccine   Genetic Screen  NIPS:   AFP:   First Screen:  Quad:   TDaP vaccine   05-27-18 Hgb A1C or  GTT Early  Third trimester  Rhogam     LAB RESULTS  Feeding Plan Breast  Blood Type --/--/O POS, O POS Performed at [facility], [address] [place], [place] [address]  [phone] 1957)  Contra   Vaginal Pap smear, abnormal    Social History   Socioeconomic History   Marital status: Married    Spouse name: Not  on file   Number of children: Not on file   Years of education: Not on file   Highest education level: Not on file  Occupational History   Not on file  Tobacco Use   Smoking status: Never   Smokeless tobacco: Never  Vaping Use   Vaping status: Never Used  Substance and Sexual Activity   Alcohol use: Never   Drug use: Never   Sexual activity: Not Currently    Birth control/protection: None  Other Topics Concern   Not on file  Social History Narrative   Not on file   Social Drivers of Health   Financial Resource Strain: Not on file  Food Insecurity: Not on file  Transportation Needs: Not on file  Physical Activity: Not on file  Stress: Not on file  Social Connections: Not on file  Intimate Partner Violence: Not on file   Family History  Problem Relation Age of Onset   Diabetes Mother    Diabetes Brother    Diabetes Maternal Aunt    Kidney disease Maternal Aunt    Diabetes Paternal Grandmother    Dementia Paternal Grandmother    Past Surgical History:  Procedure Laterality Date   CESAREAN SECTION     DILATION AND CURETTAGE OF UTERUS N/A 05/28/2022   Procedure: DILATATION AND CURETTAGE WITH INTRAOPERATIVE ULTRASOUND;  Surgeon: Ozan, Jennifer, DO;  Location: [facility] OR;  Service: Gynecology;  Laterality: N/A;   FIBULA FRACTURE SURGERY     TIBIA FRACTURE SURGERY     VULVA SURGERY     Hematoma   WISDOM TOOTH EXTRACTION       Afton Albright, MD 03/13/24 [id number]

## 2024-04-10 ENCOUNTER — Observation Stay (HOSPITAL_COMMUNITY)
Admission: EM | Admit: 2024-04-10 | Discharge: 2024-04-11 | Disposition: A | Discharge status: Home / Self Care | Attending: Internal Medicine | Admitting: Internal Medicine

## 2024-04-10 ENCOUNTER — Encounter (HOSPITAL_COMMUNITY): Payer: Self-pay

## 2024-04-10 ENCOUNTER — Other Ambulatory Visit: Payer: Self-pay

## 2024-04-10 DIAGNOSIS — Z87898 Personal history of other specified conditions: Secondary | ICD-10-CM | POA: Diagnosis not present

## 2024-04-10 DIAGNOSIS — T783XXA Angioneurotic edema, initial encounter: Secondary | ICD-10-CM | POA: Diagnosis not present

## 2024-04-10 DIAGNOSIS — T7840XA Allergy, unspecified, initial encounter: Secondary | ICD-10-CM | POA: Diagnosis present

## 2024-04-10 DIAGNOSIS — T782XXA Anaphylactic shock, unspecified, initial encounter: Secondary | ICD-10-CM | POA: Diagnosis not present

## 2024-04-10 LAB — CBC
HCT: 37.5 % (ref 36.0–46.0)
Hemoglobin: 12.5 g/dL (ref 12.0–15.0)
MCH: 27.3 pg (ref 26.0–34.0)
MCHC: 33.3 g/dL (ref 30.0–36.0)
MCV: 81.9 fL (ref 80.0–100.0)
Platelets: 245 10*3/uL (ref 150–400)
RBC: 4.58 MIL/uL (ref 3.87–5.11)
RDW: 13.6 % (ref 11.5–15.5)
WBC: 7.4 10*3/uL (ref 4.0–10.5)
nRBC: 0 % (ref 0.0–0.2)

## 2024-04-10 LAB — HEPATIC FUNCTION PANEL
ALT: 15 U/L (ref 0–44)
AST: 21 U/L (ref 15–41)
Albumin: 3.6 g/dL (ref 3.5–5.0)
Alkaline Phosphatase: 75 U/L (ref 38–126)
Bilirubin, Direct: <0.1 mg/dL (ref 0.0–0.2)
Total Bilirubin: 0.4 mg/dL (ref 0.0–1.2)
Total Protein: 7.5 g/dL (ref 6.5–8.1)

## 2024-04-10 LAB — CBC WITH DIFFERENTIAL/PLATELET
Abs Immature Granulocytes: 0.02 10*3/uL (ref 0.00–0.07)
Basophils Absolute: 0 10*3/uL (ref 0.0–0.1)
Basophils Relative: 0 %
Eosinophils Absolute: 0.1 10*3/uL (ref 0.0–0.5)
Eosinophils Relative: 1 %
HCT: 41 % (ref 36.0–46.0)
Hemoglobin: 13.6 g/dL (ref 12.0–15.0)
Immature Granulocytes: 0 %
Lymphocytes Relative: 22 %
Lymphs Abs: 1.5 10*3/uL (ref 0.7–4.0)
MCH: 27 pg (ref 26.0–34.0)
MCHC: 33.2 g/dL (ref 30.0–36.0)
MCV: 81.5 fL (ref 80.0–100.0)
Monocytes Absolute: 0.4 10*3/uL (ref 0.1–1.0)
Monocytes Relative: 6 %
Neutro Abs: 4.5 10*3/uL (ref 1.7–7.7)
Neutrophils Relative %: 71 %
Platelets: 284 10*3/uL (ref 150–400)
RBC: 5.03 MIL/uL (ref 3.87–5.11)
RDW: 13.9 % (ref 11.5–15.5)
WBC: 6.5 10*3/uL (ref 4.0–10.5)
nRBC: 0 % (ref 0.0–0.2)

## 2024-04-10 LAB — CREATININE, SERUM
Creatinine, Ser: 0.68 mg/dL (ref 0.44–1.00)
GFR, Estimated: >60 mL/min (ref >60)

## 2024-04-10 LAB — MAGNESIUM: Magnesium: 2.1 mg/dL (ref 1.7–2.4)

## 2024-04-10 LAB — BASIC METABOLIC PANEL WITH GFR
Anion gap: 11 (ref 5–15)
BUN: 7 mg/dL (ref 6–20)
CO2: 23 mmol/L (ref 22–32)
Calcium: 9.1 mg/dL (ref 8.9–10.3)
Chloride: 105 mmol/L (ref 98–111)
Creatinine, Ser: 0.63 mg/dL (ref 0.44–1.00)
GFR, Estimated: >60 mL/min (ref >60)
Glucose, Bld: 81 mg/dL (ref 70–99)
Potassium: 3.2 mmol/L — ABNORMAL LOW (ref 3.5–5.1)
Sodium: 139 mmol/L (ref 135–145)

## 2024-04-10 LAB — C-REACTIVE PROTEIN: CRP: 2 mg/dL — ABNORMAL HIGH (ref <1.0)

## 2024-04-10 LAB — HIV ANTIBODY (ROUTINE TESTING W REFLEX): HIV Screen 4th Generation wRfx: NONREACTIVE

## 2024-04-10 LAB — SEDIMENTATION RATE: Sed Rate: 16 mm/h (ref 0–22)

## 2024-04-10 MED ORDER — METHYLPREDNISOLONE SODIUM SUCC 125 MG IJ SOLR
125.0000 mg | Freq: Once | INTRAMUSCULAR | Status: AC
  Administered 2024-04-10: 125 mg via INTRAVENOUS
  Filled 2024-04-10: qty 2

## 2024-04-10 MED ORDER — DIPHENHYDRAMINE HCL 25 MG PO CAPS
25.0000 mg | ORAL_CAPSULE | Freq: Once | ORAL | Status: AC | PRN
  Administered 2024-04-10: 25 mg via ORAL
  Filled 2024-04-10: qty 1

## 2024-04-10 MED ORDER — FAMOTIDINE 20 MG PO TABS
20.0000 mg | ORAL_TABLET | Freq: Once | ORAL | Status: AC
  Administered 2024-04-10: 20 mg via ORAL
  Filled 2024-04-10: qty 1

## 2024-04-10 MED ORDER — ENOXAPARIN SODIUM 40 MG/0.4ML IJ SOSY: 40.0000 mg | PREFILLED_SYRINGE | INTRAMUSCULAR | Status: DC

## 2024-04-10 MED ORDER — EPINEPHRINE 0.3 MG/0.3ML IJ SOAJ
0.3000 mg | Freq: Once | INTRAMUSCULAR | Status: AC
  Administered 2024-04-10: 0.3 mg via INTRAMUSCULAR
  Filled 2024-04-10: qty 0.3

## 2024-04-10 MED ORDER — POTASSIUM CHLORIDE CRYS ER 20 MEQ PO TBCR
40.0000 meq | EXTENDED_RELEASE_TABLET | Freq: Once | ORAL | Status: AC
  Administered 2024-04-10: 40 meq via ORAL
  Filled 2024-04-10: qty 2

## 2024-04-10 MED ORDER — ACETAMINOPHEN 325 MG PO TABS
650.0000 mg | ORAL_TABLET | Freq: Four times a day (QID) | ORAL | Status: DC | PRN
  Administered 2024-04-10: 650 mg via ORAL
  Filled 2024-04-10: qty 2

## 2024-04-10 NOTE — ED Provider Notes (Signed)
 [place] EMERGENCY DEPARTMENT AT [facility] Provider Note   CSN: [id number] Arrival date & time: 04/10/24  1062     History  Chief Complaint  Patient presents with   Allergic Reaction    Christy [name] is a 38 y.o. female.  The history is provided by the patient and medical records. No language interpreter was used.  Allergic Reaction    38 year old female with history of prior allergies to penicillin, soybean oil, history of sickle cell trait presenting to the ER by private vehicle with complaint of bloody.  Patient states around 3 PM yesterday she felt her itchiness on her skin and she was scratching thinking that it may be an insect bite because she was outside.  Later on she noticed hives throughout her body.  This morning she got up itching, she took 3 Benadryl  tablets (75 mg) and shower, afterward she noticed that her lips were swollen thus prompting this ER visit.  She does also endorse some mild tightness around the right side of her neck and states she is having some mild shortness of breath without any coughing.  She does not endorse any abdominal cramping lightheadedness or dizziness.  Patient states that she did eat a subsandwich yesterday afternoon possibly before her reaction.  She cannot recall any other new changes.  Denies any new medication changes, soap, detergent, environmental anything else that she can recall.  No nausea, diarrhea.  Mild migraine headache which is usual for her.  Home Medications Prior to Admission medications   Medication Sig Start Date End Date Taking? Authorizing Provider  acetaminophen  (TYLENOL ) 325 MG tablet Take 650 mg by mouth every 6 (six) hours as needed for mild pain or headache. Patient not taking: Reported on 09/26/2022    [provider]  acetaminophen  (TYLENOL ) 500 MG tablet Take 500 mg by mouth every 6 (six) hours as needed. 03/25/23   [provider]  albuterol  (VENTOLIN  HFA) 108 (90 Base)  MCG/ACT inhaler Inhale 1-2 puffs into the lungs every 6 (six) hours as needed for wheezing or shortness of breath. 10/03/22   Dodson Freestone, FNP  cyclobenzaprine  (FLEXERIL ) 10 MG tablet Take 10 mg by mouth as directed. 03/25/23   [provider]  guaiFENesin  200 MG tablet Take 1 tablet (200 mg total) by mouth every 4 (four) hours as needed for cough or to loosen phlegm. 10/03/22   Dodson Freestone, FNP  ibuprofen  (ADVIL ) 400 MG tablet Take 400 mg by mouth as directed. 03/25/23   [provider]  ibuprofen  (ADVIL ) 600 MG tablet Take 1 tablet (600 mg total) by mouth every 8 (eight) hours as needed. 05/30/22   Raynell Caller, MD  predniSONE  (DELTASONE ) 20 MG tablet Take 2 tablets (40 mg total) by mouth daily. 03/13/24   Afton Albright, MD  furosemide  (LASIX ) 20 MG tablet Take 1 tablet (20 mg total) by mouth daily. Patient not taking: Reported on 09/05/2018 07/30/18 03/20/21  Alissa April, MD      Allergies    Soybean oil, Amoxicillin, Penicillin v potassium, and Penicillins    Review of Systems   Review of Systems  All other systems reviewed and are negative.   Physical Exam Updated Vital Signs BP (!) 154/88 (BP Location: Right Arm)   Pulse 69   Temp 98.3 F (36.8 C)   Resp 15   Ht 5\' 5"  (1.651 m)   Wt 72.6 kg   LMP  (LMP Unknown)   SpO2 100%   BMI 26.63  kg/m  Physical Exam Vitals and nursing note reviewed.  Constitutional:      General: She is not in acute distress.    Appearance: She is well-developed.     Comments: Patient is resting in bed appears to be in no acute discomfort.  HENT:     Head: Atraumatic.     Mouth/Throat:     Comments: Angioedema involving both upper and lower lip.  Normal tongue, normal uvula, no swelling to the sublingual space. Eyes:     Conjunctiva/sclera: Conjunctivae normal.  Neck:     Comments: Neck is supple, trachea midline Cardiovascular:     Rate and Rhythm: Normal rate and regular rhythm.     Pulses: Normal pulses.     Heart  sounds: Normal heart sounds.  Pulmonary:     Effort: Pulmonary effort is normal.     Breath sounds: No wheezing, rhonchi or rales.  Abdominal:     Palpations: Abdomen is soft.     Tenderness: There is no abdominal tenderness.  Musculoskeletal:     Cervical back: Normal range of motion and neck supple.  Skin:    Findings: Rash (Faint urticarial rash noted to trunk and arms and legs along with excoriation marks) present.  Neurological:     Mental Status: She is alert and oriented to person, place, and time.  Psychiatric:        Mood and Affect: Mood normal.     ED Results / Procedures / Treatments   Labs (all labs ordered are listed, but only abnormal results are displayed) Labs Reviewed  BASIC METABOLIC PANEL WITH GFR - Abnormal; Notable for the following components:      Result Value   Potassium 3.2 (*)    All other components within normal limits  CBC WITH DIFFERENTIAL/PLATELET  HIV ANTIBODY (ROUTINE TESTING W REFLEX)  CBC  CREATININE, SERUM  MAGNESIUM    EKG None  Radiology No results found.  Procedures .Critical Care  Performed by: Debbra Fairy, PA-C Authorized by: Debbra Fairy, PA-C   Critical care provider statement:    Critical care time (minutes):  30   Critical care was time spent personally by me on the following activities:  Development of treatment plan with patient or surrogate, discussions with consultants, evaluation of patient's response to treatment, examination of patient, ordering and review of laboratory studies, ordering and review of radiographic studies, ordering and performing treatments and interventions, pulse oximetry, re-evaluation of patient's condition and review of old charts     Medications Ordered in ED Medications  enoxaparin  (LOVENOX ) injection 40 mg (has no administration in time range)  potassium chloride SA (KLOR-CON M) CR tablet 40 mEq (has no administration in time range)  methylPREDNISolone sodium succinate (SOLU-MEDROL) 125  mg/2 mL injection 125 mg (125 mg Intravenous Given 04/10/24 0822)  famotidine (PEPCID) tablet 20 mg (20 mg Oral Given 04/10/24 0821)  EPINEPHrine (EPI-PEN) injection 0.3 mg (0.3 mg Intramuscular Given 04/10/24 0825)    ED Course/ Medical Decision Making/ A&P                                 Medical Decision Making Amount and/or Complexity of Data Reviewed Labs: ordered.  Risk Prescription drug management. Decision regarding hospitalization.   BP (!) 154/88 (BP Location: Right Arm)   Pulse 69   Temp 98.3 F (36.8 C)   Resp 15   Ht 5\' 5"  (1.651 m)   Wt 72.6  kg   LMP  (LMP Unknown)   SpO2 100%   BMI 26.63 kg/m   80:49 AM  38 year old female with history of prior allergies to penicillin, soybean oil, history of sickle cell trait presenting to the ER by private vehicle with complaint of bloody.  Patient states around 3 PM yesterday she felt her itchiness on her skin and she was scratching thinking that it may be an insect bite because she was outside.  Later on she noticed hives throughout her body.  This morning she got up itching, she took 3 Benadryl  tablets (75 mg) and shower, afterward she noticed that her lips were swollen thus prompting this ER visit.  She does also endorse some mild tightness around the right side of her neck and states she is having some mild shortness of breath without any coughing.  She does not endorse any abdominal cramping lightheadedness or dizziness.  Patient states that she did eat a subsandwich yesterday afternoon possibly before her reaction.  She cannot recall any other new changes.  Denies any new medication changes, soap, detergent, environmental anything else that she can recall.  No nausea, diarrhea.  Mild migraine headache which is usual for her.  On exam, patient is resting comfortably in bed appears to be in no acute discomfort.  She does have evidence of angioedema involving both upper and lower lip.  She has normal oral mucosa, tongue is normal, her  phonation is normal.  Neck is supple, trachea midline, lungs are clear without any overt wheezes rales or rhonchi.  She does have faint urticarial rash noted in body.  Her symptom is concerning for anaphylactic reaction therefore patient will receive EpiPen, Solu-Medrol, and Pepcid.  Patient recently took Benadryl  prior to arrival.  Currently her blood pressure is 154/88.  She does not need IV fluids at this time.  Screening labs including CBC and BMP ordered.  Will monitor closely for at least 4 hours.  Unsure the triggers.  EMR review it appears patient was seen on 03/13/2024 for similar presentation.  At that time she has sensation of itchiness and lip swelling but no objective finding.  -Labs ordered, independently viewed and interpreted by me.  Labs remarkable for potassium of 3.2, will provide supplementation -The patient was maintained on a cardiac monitor.  I personally viewed and interpreted the cardiac monitored which showed an underlying rhythm of: Normal sinus rhythm -Imaging not considered -This patient presents to the ED for concern of angioedema, this involves an extensive number of treatment options, and is a complaint that carries with it a high risk of complications and morbidity.  The differential diagnosis includes anaphylaxis, allergic reaction, drug-induced angioedema, localized reaction -Co morbidities that complicate the patient evaluation includes allergies to soybean oil -Treatment includes epinephrine, Pepcid, Solu-Medrol -Reevaluation of the patient after these medicines showed that the patient stayed the same -PCP office notes or outside notes reviewed -Discussion with specialist [facility] hospitalist Dr. Sulema Endo who agrees to admit patient for obs -Escalation to admission/observation considered: patient is agreeable with admission.          Final Clinical Impression(s) / ED Diagnoses Final diagnoses:  Angioedema, initial encounter  Anaphylaxis, initial encounter     Rx / DC Orders ED Discharge Orders     None         Debbra Fairy, PA-C 04/10/24 1334    Auston Blush, MD 04/10/24 [phone]

## 2024-04-10 NOTE — ED Triage Notes (Addendum)
 Lip swelling, urticaria and subjective right sided neck swelling that manifested last night.   75mg  benadryl  ~3am.

## 2024-04-10 NOTE — Progress Notes (Signed)
 Transition of Care [facility]) - Inpatient Brief Assessment   Patient Details  Name: Christy [name] MRN: [id number] Date of Birth: 1986-05-03  Transition of Care [facility]) CM/SW Contact:    Dane Dung, RN Phone Number: 04/10/2024, 4:28 PM   Clinical Narrative: Patient admitted to the hospital with allergic reaction.  No TOC needs at this time.  Patient is independent and should likely return home when medically stable for discharge.   Transition of Care Asessment: Insurance and Status: (P) Insurance coverage has been reviewed Patient has primary care physician: (P) Yes Home environment has been reviewed: (P) from home Prior level of function:: (P) Independent Prior/Current Home Services: (P) No current home services Social Drivers of Health Review: (P) SDOH reviewed no interventions necessary Readmission risk has been reviewed: (P) Yes Transition of care needs: (P) no transition of care needs at this time

## 2024-04-10 NOTE — H&P (Signed)
 History and Physical    Patient: Christy [name] VOZ:[id number] DOB: February 28, 1986 DOA: 04/10/2024 DOS: the patient was seen and examined on 04/10/2024 PCP: Jonathon Neighbors, MD  Patient coming from: Home  Chief Complaint:  Chief Complaint  Patient presents with   Allergic Reaction   HPI: Christy [name] is a 38 y.o. 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  Pt states that she was in her USOH until waking up at 0400 this morning with a diffuse body rash. Pt states that she felt diffuse itchiness at 0330 before going to bed and took a dose of Benadryl . When she awoke again at 0400 she had an urticarial eruption over her entire body and figured she'd hop in the shower. Once she got out of the shower, she noted that her entire face (eyes and lips) were swollen; as such, pt called an [facility] and was brought to the ED. She denies throat closure or difficulty swallowing. She denies any changes in meds, body soaps or laundry detergents. Of note, pt does endorse eating unknown [place] food from a co-worker the day pta around 1400.  In the ED, pt AFVSS. Labs notable K 3.2. Received IV epinephrine x1, IV solumedrol x1 and famotidine x1 in the ED. Pt admitted to medicine for closer observation.  Review of Systems: As mentioned in the history of present illness. All other systems reviewed and are negative. Past Medical History:  Diagnosis Date   Anemia    Counseling for birth control, oral contraceptives 11/29/2023   Diarrhea during pregnancy 07/16/2018   Nausea and vomiting of pregnancy, antepartum 03/04/2018   Preterm labor    Sickle cell trait [facility])    Supervision of other normal pregnancy, antepartum 02/25/2018    Nursing Staff Provider Office Location  [facility]  Dating  U/S Language  English Anatomy US   02-27-18 Flu Vaccine   Genetic Screen  NIPS:   AFP:   First Screen:  Quad:   TDaP vaccine   05-27-18 Hgb A1C or   GTT Early  Third trimester  Rhogam     LAB RESULTS  Feeding Plan Breast  Blood Type --/--/O POS, O POS Performed at [facility], [address] [place], [place] [address]  [id number]/29 1959)  Contra   Vaginal Pap smear, abnormal    Past Surgical History:  Procedure Laterality Date   CESAREAN SECTION     DILATION AND CURETTAGE OF UTERUS N/A 05/28/2022   Procedure: DILATATION AND CURETTAGE WITH INTRAOPERATIVE ULTRASOUND;  Surgeon: Ozan, Jennifer, DO;  Location: [facility] OR;  Service: Gynecology;  Laterality: N/A;   FIBULA FRACTURE SURGERY     TIBIA FRACTURE SURGERY     VULVA SURGERY     Hematoma   WISDOM TOOTH EXTRACTION     Social History:  reports that she has never smoked. She has never used smokeless tobacco. She reports that she does not drink alcohol and does not use drugs.  Allergies  Allergen Reactions   Soybean Oil Anaphylaxis    Other Reaction(s): Throat irritation, Throat swelling, Throat irritation, Throat swelling   Amoxicillin Hives    Other Reaction(s): Vomiting   Penicillin V Potassium     Other Reaction(s): Unknown   Penicillins Hives and Nausea And Vomiting    Has patient had a PCN reaction causing immediate rash, facial/tongue/throat swelling, SOB or lightheadedness with hypotension: Yes, no anaphylaxis reaction.  Has patient had a PCN reaction causing severe rash involving mucus  membranes or skin necrosis: Yes Has patient had a PCN reaction that required hospitalization: No Has patient had a PCN reaction occurring within the last 10 years: Yes If all of the above answers are "NO", then may proceed with Cephalosporin use.     Family History  Problem Relation Age of Onset   Diabetes Mother    Diabetes Brother    Diabetes Maternal Aunt    Kidney disease Maternal Aunt    Diabetes Paternal Grandmother    Dementia Paternal Grandmother     Prior to Admission medications   Medication Sig Start Date End Date Taking? Authorizing Provider  acetaminophen  (TYLENOL ) 325 MG  tablet Take 650 mg by mouth every 6 (six) hours as needed for mild pain or headache. Patient not taking: Reported on 09/26/2022    [provider]  acetaminophen  (TYLENOL ) 500 MG tablet Take 500 mg by mouth every 6 (six) hours as needed. 03/25/23   [provider]  albuterol  (VENTOLIN  HFA) 108 (90 Base) MCG/ACT inhaler Inhale 1-2 puffs into the lungs every 6 (six) hours as needed for wheezing or shortness of breath. 10/03/22   Dodson Freestone, FNP  cyclobenzaprine  (FLEXERIL ) 10 MG tablet Take 10 mg by mouth as directed. 03/25/23   [provider]  guaiFENesin  200 MG tablet Take 1 tablet (200 mg total) by mouth every 4 (four) hours as needed for cough or to loosen phlegm. 10/03/22   Dodson Freestone, FNP  ibuprofen  (ADVIL ) 400 MG tablet Take 400 mg by mouth as directed. 03/25/23   [provider]  ibuprofen  (ADVIL ) 600 MG tablet Take 1 tablet (600 mg total) by mouth every 8 (eight) hours as needed. 05/30/22   Raynell Caller, MD  predniSONE  (DELTASONE ) 20 MG tablet Take 2 tablets (40 mg total) by mouth daily. 03/13/24   Afton Albright, MD  furosemide  (LASIX ) 20 MG tablet Take 1 tablet (20 mg total) by mouth daily. Patient not taking: Reported on 09/05/2018 07/30/18 03/20/21  Alissa April, MD    Physical Exam: Vitals:   04/10/24 0915 04/10/24 1124 04/10/24 1224 04/10/24 1350  BP: 122/70  126/77 127/71  Pulse: 85  67 70  Resp: (!) 21  18 15   Temp:  98.4 F (36.9 C) 98.2 F (36.8 C) 98 F (36.7 C)  TempSrc:    Oral  SpO2: 100%  100% 100%  Weight:      Height:       General: Alert, oriented x3, resting comfortably in no acute distress Respiratory: Lungs clear to auscultation bilaterally with normal respiratory effort; no w/r/r Cardiovascular: Regular rate and rhythm w/o m/r/g  Data Reviewed:  Lab Results  Component Value Date   WBC 7.4 04/10/2024   HGB 12.5 04/10/2024   HCT 37.5 04/10/2024   MCV 81.9 04/10/2024   PLT 245 04/10/2024   Lab Results   Component Value Date   GLUCOSE 81 04/10/2024   CALCIUM  9.1 04/10/2024   NA 139 04/10/2024   K 3.2 (L) 04/10/2024   CO2 23 04/10/2024   CL 105 04/10/2024   BUN 7 04/10/2024   CREATININE 0.68 04/10/2024   Lab Results  Component Value Date   ALT 24 05/28/2022   AST 19 05/28/2022   ALKPHOS 73 05/28/2022   BILITOT 0.5 05/28/2022   Lab Results  Component Value Date   INR 1.1 07/22/2019    Radiology: No results found.  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  Presumed allergic reaction H/o PCN and ?soybean allergy Unclear cause and exposure. S/p epinephrine, solumedrol, and famotidine with significant improvement. -Epi pen on d/c -OP Allergy consult on d/c -F/u ESR/CRP -F/u C4 complement, if abnl then complete w/u for C1 esterase def   Advance Care Planning:   Code Status: Full Code   Consults: N/A  Family Communication: N/A  Severity of Illness: The appropriate patient status for this patient is OBSERVATION. Observation status is judged to be reasonable and necessary in order to provide the required intensity of service to ensure the patient's safety. The patient's presenting symptoms, physical exam findings, and initial radiographic and laboratory data in the context of their medical condition is felt to place them at decreased risk for further clinical deterioration. Furthermore, it is anticipated that the patient will be medically stable for discharge from the hospital within 2 midnights of admission.    ------- I spent 55 minutes reviewing previous labs/notes, obtaining separate history at the bedside, counseling/discussing the treatment plan outlined above, ordering medications/tests, and performing clinical documentation.  Author: Arne Langdon, MD 04/10/2024 2:51 PM  For on call review [email or web address].

## 2024-04-10 NOTE — Plan of Care (Signed)
  Problem: Education: Goal: Knowledge of General Education information will improve Description: Including pain rating scale, medication(s)/side effects and non-pharmacologic comfort measures Outcome: Progressing   Problem: Clinical Measurements: Goal: Ability to maintain clinical measurements within normal limits will improve Outcome: Progressing Goal: Will remain free from infection Outcome: Progressing   Problem: Nutrition: Goal: Adequate nutrition will be maintained Outcome: Progressing   Problem: Coping: Goal: Level of anxiety will decrease Outcome: Progressing   Problem: Pain Managment: Goal: General experience of comfort will improve and/or be controlled Outcome: Progressing

## 2024-04-11 ENCOUNTER — Other Ambulatory Visit (HOSPITAL_COMMUNITY): Payer: Self-pay

## 2024-04-11 DIAGNOSIS — T783XXD Angioneurotic edema, subsequent encounter: Secondary | ICD-10-CM

## 2024-04-11 LAB — BASIC METABOLIC PANEL WITH GFR
Anion gap: 10 (ref 5–15)
BUN: 9 mg/dL (ref 6–20)
CO2: 20 mmol/L — ABNORMAL LOW (ref 22–32)
Calcium: 8.9 mg/dL (ref 8.9–10.3)
Chloride: 110 mmol/L (ref 98–111)
Creatinine, Ser: 0.67 mg/dL (ref 0.44–1.00)
GFR, Estimated: >60 mL/min (ref >60)
Glucose, Bld: 126 mg/dL — ABNORMAL HIGH (ref 70–99)
Potassium: 3.9 mmol/L (ref 3.5–5.1)
Sodium: 140 mmol/L (ref 135–145)

## 2024-04-11 LAB — CBC
HCT: 32.4 % — ABNORMAL LOW (ref 36.0–46.0)
Hemoglobin: 11.1 g/dL — ABNORMAL LOW (ref 12.0–15.0)
MCH: 26.9 pg (ref 26.0–34.0)
MCHC: 34.3 g/dL (ref 30.0–36.0)
MCV: 78.6 fL — ABNORMAL LOW (ref 80.0–100.0)
Platelets: 349 10*3/uL (ref 150–400)
RBC: 4.12 MIL/uL (ref 3.87–5.11)
RDW: 13.5 % (ref 11.5–15.5)
WBC: 12.4 10*3/uL — ABNORMAL HIGH (ref 4.0–10.5)
nRBC: 0 % (ref 0.0–0.2)

## 2024-04-11 LAB — C4 COMPLEMENT: Complement C4, Body Fluid: 35 mg/dL (ref 12–38)

## 2024-04-11 MED ORDER — PREDNISONE 50 MG PO TABS
50.0000 mg | ORAL_TABLET | Freq: Every day | ORAL | 0 refills | Status: AC
  Filled 2024-04-11 (×2): qty 4, 4d supply, fill #0

## 2024-04-11 MED ORDER — METOPROLOL TARTRATE 5 MG/5ML IV SOLN: 5.0000 mg | INTRAVENOUS | Status: DC | PRN

## 2024-04-11 MED ORDER — DIPHENHYDRAMINE HCL 25 MG PO CAPS: 50.0000 mg | ORAL_CAPSULE | Freq: Four times a day (QID) | ORAL | Status: DC | PRN

## 2024-04-11 MED ORDER — LORATADINE 10 MG PO TABS
10.0000 mg | ORAL_TABLET | Freq: Every day | ORAL | Status: DC
  Administered 2024-04-11: 10 mg via ORAL
  Filled 2024-04-11: qty 1

## 2024-04-11 MED ORDER — IPRATROPIUM-ALBUTEROL 0.5-2.5 (3) MG/3ML IN SOLN: 3.0000 mL | RESPIRATORY_TRACT | Status: DC | PRN

## 2024-04-11 MED ORDER — FAMOTIDINE 20 MG PO TABS
20.0000 mg | ORAL_TABLET | Freq: Two times a day (BID) | ORAL | 0 refills | Status: DC
  Filled 2024-04-11 (×2): qty 8, 4d supply, fill #0

## 2024-04-11 MED ORDER — FAMOTIDINE 20 MG PO TABS
20.0000 mg | ORAL_TABLET | Freq: Two times a day (BID) | ORAL | Status: DC
  Administered 2024-04-11: 20 mg via ORAL
  Filled 2024-04-11: qty 1

## 2024-04-11 MED ORDER — GLUCAGON HCL RDNA (DIAGNOSTIC) 1 MG IJ SOLR: 1.0000 mg | INTRAMUSCULAR | Status: DC | PRN

## 2024-04-11 MED ORDER — DIPHENHYDRAMINE HCL 25 MG PO TABS: 25.0000 mg | ORAL_TABLET | Freq: Four times a day (QID) | ORAL | Status: DC | PRN

## 2024-04-11 MED ORDER — HYDRALAZINE HCL 20 MG/ML IJ SOLN: 10.0000 mg | INTRAMUSCULAR | Status: DC | PRN

## 2024-04-11 MED ORDER — PREDNISONE 20 MG PO TABS
50.0000 mg | ORAL_TABLET | Freq: Every day | ORAL | Status: DC
  Administered 2024-04-11: 50 mg via ORAL
  Filled 2024-04-11: qty 1

## 2024-04-11 MED ORDER — EPINEPHRINE 0.3 MG/0.3ML IJ SOAJ
0.3000 mg | INTRAMUSCULAR | 0 refills | Status: DC | PRN
  Filled 2024-04-11: qty 2, fill #0
  Filled 2024-04-11: qty 2, 7d supply, fill #0
  Filled 2024-04-11: qty 2, 2d supply, fill #0

## 2024-04-11 MED ORDER — LORATADINE 10 MG PO TABS
10.0000 mg | ORAL_TABLET | Freq: Every day | ORAL | Status: AC
Start: 2024-04-11 — End: 2024-04-16

## 2024-04-11 NOTE — Discharge Summary (Signed)
 Physician Discharge Summary  Tiffnay Bossi WUJ:[id number] DOB: 03-May-1986 DOA: 04/10/2024  PCP: Jonathon Neighbors, MD  Admit date: 04/10/2024 Discharge date: 04/11/2024  Admitted From: Home Disposition: Home  Recommendations for Outpatient Follow-up:  Follow up with PCP in 1-2 weeks Please obtain BMP/CBC in one week your next doctors visit.  Prednisone  and Pepcid prescribed.  Advised to take over-the-counter loratadine  Advised to take over-the-counter Benadryl  as needed Recommend outpatient follow-up with allergy immunology.  Advised her to call her insurance to see who is within network so she can follow-up with them within next couple of weeks.  Discharge Condition: Stable CODE STATUS: Full code Diet recommendation: Regular  Brief/Interim Summary: Brief Narrative:  37 y.o. 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  On the day of admission around 4 AM woke up with diffuse body rash and itching and later in the day after she got out of shower her entire face eyes and lips were swollen.  She called [facility] and came to the hospital.  No identifying factors. No obvious source, CRP minimally elevated.  She is feeling significantly better and almost back to her baseline without any respiratory compromise or stridor.  Initially in the ER received epinephrine, Solu-Medrol and Pepcid.  Will transition to p.o. prednisone , p.o. Pepcid and Claritin .  Will also advised her to take p.o. Benadryl  as needed.  I have advised her to follow-up outpatient allergy immunology.  Today she is medically stable for discharge   Assessment & Plan:  Principal Problem:   Angioedema    Presumed allergic reaction H/o PCN and ?soybean allergy -No obvious source, CRP minimally elevated.  She is feeling significantly better and almost back to her baseline without any respiratory compromise or stridor.  Initially in the ER received  epinephrine, Solu-Medrol and Pepcid.  Will transition to p.o. prednisone , p.o. Pepcid and Claritin .  Will also advised her to take p.o. Benadryl  as needed.  I have advised her to follow-up outpatient allergy immunology.    DVT prophylaxis: enoxaparin  (LOVENOX ) injection 40 mg Start: 04/10/24 1345      Code Status: Full Code Family Communication:   Discharge today    Subjective: Feeling significantly well wishing to go home.   Examination:  General exam: Appears calm and comfortable  Respiratory system: Clear to auscultation. Respiratory effort normal. Cardiovascular system: S1 & S2 heard, RRR. No JVD, murmurs, rubs, gallops or clicks. No pedal edema. Gastrointestinal system: Abdomen is nondistended, soft and nontender. No organomegaly or masses felt. Normal bowel sounds heard. Central nervous system: Alert and oriented. No focal neurological deficits. Extremities: Symmetric 5 x 5 power. Skin: No rashes, lesions or ulcers Psychiatry: Judgement and insight appear normal. Mood & affect appropriate.    Discharge Diagnoses:  Principal Problem:   Angioedema      Discharge Exam: Vitals:   04/10/24 2327 04/11/24 0534  BP: 126/72 (!) 94/47  Pulse: 87 67  Resp: 18 18  Temp: 97.9 F (36.6 C) 97.8 F (36.6 C)  SpO2: 100% 99%   Vitals:   04/10/24 1516 04/10/24 2038 04/10/24 2327 04/11/24 0534  BP: 114/65 133/67 126/72 (!) 94/47  Pulse: 63 92 87 67  Resp: 17 18 18 18   Temp: 98.6 F (37 C) 100 F (37.8 C) 97.9 F (36.6 C) 97.8 F (36.6 C)  TempSrc: Oral Oral Oral Oral  SpO2: 98% 100% 100% 99%  Weight:      Height:  Discharge Instructions   Allergies as of 04/11/2024       Reactions   Soybean Oil Anaphylaxis   Other Reaction(s): Throat irritation, Throat swelling, Throat irritation, Throat swelling   Amoxicillin Hives   Other Reaction(s): Vomiting   Penicillin V Potassium    Other Reaction(s): Unknown   Penicillins Hives, Nausea And Vomiting   Has  patient had a PCN reaction causing immediate rash, facial/tongue/throat swelling, SOB or lightheadedness with hypotension: Yes, no anaphylaxis reaction.  Has patient had a PCN reaction causing severe rash involving mucus membranes or skin necrosis: Yes Has patient had a PCN reaction that required hospitalization: No Has patient had a PCN reaction occurring within the last 10 years: Yes If all of the above answers are "NO", then may proceed with Cephalosporin use.        Medication List     TAKE these medications    acetaminophen  500 MG tablet Commonly known as: TYLENOL  Take 500 mg by mouth every 6 (six) hours as needed.   albuterol  108 (90 Base) MCG/ACT inhaler Commonly known as: VENTOLIN  HFA Inhale 1-2 puffs into the lungs every 6 (six) hours as needed for wheezing or shortness of breath.   cyclobenzaprine  10 MG tablet Commonly known as: FLEXERIL  Take 10 mg by mouth as needed for muscle spasms.   diphenhydrAMINE  25 MG tablet Commonly known as: BENADRYL  Take 1 tablet (25 mg total) by mouth every 6 (six) hours as needed for itching.   famotidine 20 MG tablet Commonly known as: PEPCID Take 1 tablet (20 mg total) by mouth 2 (two) times daily for 4 days.   ibuprofen  400 MG tablet Commonly known as: ADVIL  Take 400 mg by mouth as needed for moderate pain (pain score 4-6).   loratadine  10 MG tablet Commonly known as: CLARITIN  Take 1 tablet (10 mg total) by mouth daily for 5 days.   predniSONE  50 MG tablet Commonly known as: DELTASONE  Take 1 tablet (50 mg total) by mouth daily with breakfast for 4 days.        Follow-up Information     Jonathon Neighbors, MD Follow up in 1 week(s).   Specialty: Family Medicine Contact information: [address] [place] [address] [phone]                Allergies  Allergen Reactions   Soybean Oil Anaphylaxis    Other Reaction(s): Throat irritation, Throat swelling, Throat irritation, Throat swelling   Amoxicillin  Hives    Other Reaction(s): Vomiting   Penicillin V Potassium     Other Reaction(s): Unknown   Penicillins Hives and Nausea And Vomiting    Has patient had a PCN reaction causing immediate rash, facial/tongue/throat swelling, SOB or lightheadedness with hypotension: Yes, no anaphylaxis reaction.  Has patient had a PCN reaction causing severe rash involving mucus membranes or skin necrosis: Yes Has patient had a PCN reaction that required hospitalization: No Has patient had a PCN reaction occurring within the last 10 years: Yes If all of the above answers are "NO", then may proceed with Cephalosporin use.     You were cared for by a hospitalist during your hospital stay. If you have any questions about your discharge medications or the care you received while you were in the hospital after you are discharged, you can call the unit and asked to speak with the hospitalist on call if the hospitalist that took care of you is not available. Once you are discharged, your primary care physician will  handle any further medical issues. Please note that no refills for any discharge medications will be authorized once you are discharged, as it is imperative that you return to your primary care physician (or establish a relationship with a primary care physician if you do not have one) for your aftercare needs so that they can reassess your need for medications and monitor your lab values.  You were cared for by a hospitalist during your hospital stay. If you have any questions about your discharge medications or the care you received while you were in the hospital after you are discharged, you can call the unit and asked to speak with the hospitalist on call if the hospitalist that took care of you is not available. Once you are discharged, your primary care physician will handle any further medical issues. Please note that NO REFILLS for any discharge medications will be authorized once you are discharged, as it  is imperative that you return to your primary care physician (or establish a relationship with a primary care physician if you do not have one) for your aftercare needs so that they can reassess your need for medications and monitor your lab values.  Please request your Prim.MD to go over all Hospital Tests and Procedure/Radiological results at the follow up, please get all Hospital records sent to your Prim MD by signing hospital release before you go home.  Get CBC, CMP, 2 view Chest X ray checked  by Primary MD during your next visit or SNF MD in 5-7 days ( we routinely change or add medications that can affect your baseline labs and fluid status, therefore we recommend that you get the mentioned basic workup next visit with your PCP, your PCP may decide not to get them or add new tests based on their clinical decision)  On your next visit with your primary care physician please Get Medicines reviewed and adjusted.  If you experience worsening of your admission symptoms, develop shortness of breath, life threatening emergency, suicidal or homicidal thoughts you must seek medical attention immediately by calling 911 or calling your MD immediately  if symptoms less severe.  You Must read complete instructions/literature along with all the possible adverse reactions/side effects for all the Medicines you take and that have been prescribed to you. Take any new Medicines after you have completely understood and accpet all the possible adverse reactions/side effects.   Do not drive, operate heavy machinery, perform activities at heights, swimming or participation in water activities or provide baby sitting services if your were admitted for syncope or siezures until you have seen by Primary MD or a Neurologist and advised to do so again.  Do not drive when taking Pain medications.   Procedures/Studies: No results found.   The results of significant diagnostics from this hospitalization (including  imaging, microbiology, ancillary and laboratory) are listed below for reference.     Microbiology: No results found for this or any previous visit (from the past 240 hours).   Labs: BNP (last 3 results) No results for input(s): "BNP" in the last 8760 hours. Basic Metabolic Panel: Recent Labs  Lab 04/10/24 0827 04/10/24 1331 04/11/24 0245  NA 139  --  140  K 3.2*  --  3.9  CL 105  --  110  CO2 23  --  20*  GLUCOSE 81  --  126*  BUN 7  --  9  CREATININE 0.63 0.68 0.67  CALCIUM  9.1  --  8.9  MG  --  2.1  --    Liver Function Tests: Recent Labs  Lab 04/10/24 1708  AST 21  ALT 15  ALKPHOS 75  BILITOT 0.4  PROT 7.5  ALBUMIN 3.6   No results for input(s): "LIPASE", "AMYLASE" in the last 168 hours. No results for input(s): "AMMONIA" in the last 168 hours. CBC: Recent Labs  Lab 04/10/24 0827 04/10/24 1331 04/11/24 0245  WBC 6.5 7.4 12.4*  NEUTROABS 4.5  --   --   HGB 13.6 12.5 11.1*  HCT 41.0 37.5 32.4*  MCV 81.5 81.9 78.6*  PLT 284 245 349   Cardiac Enzymes: No results for input(s): "CKTOTAL", "CKMB", "CKMBINDEX", "TROPONINI" in the last 168 hours. BNP: Invalid input(s): "POCBNP" CBG: No results for input(s): "GLUCAP" in the last 168 hours. D-Dimer No results for input(s): "DDIMER" in the last 72 hours. Hgb A1c No results for input(s): "HGBA1C" in the last 72 hours. Lipid Profile No results for input(s): "CHOL", "HDL", "LDLCALC", "TRIG", "CHOLHDL", "LDLDIRECT" in the last 72 hours. Thyroid function studies No results for input(s): "TSH", "T4TOTAL", "T3FREE", "THYROIDAB" in the last 72 hours.  Invalid input(s): "FREET3" Anemia work up No results for input(s): "VITAMINB12", "FOLATE", "FERRITIN", "TIBC", "IRON ", "RETICCTPCT" in the last 72 hours. Urinalysis    Component Value Date/Time   COLORURINE YELLOW 05/26/2022 1338   APPEARANCEUR HAZY (A) 05/26/2022 1338   LABSPEC 1.013 05/26/2022 1338   PHURINE 6.0 05/26/2022 1338   GLUCOSEU NEGATIVE 05/26/2022  1338   HGBUR LARGE (A) 05/26/2022 1338   BILIRUBINUR NEGATIVE 05/26/2022 1338   KETONESUR NEGATIVE 05/26/2022 1338   PROTEINUR NEGATIVE 05/26/2022 1338   NITRITE NEGATIVE 05/26/2022 1338   LEUKOCYTESUR LARGE (A) 05/26/2022 1338   Sepsis Labs Recent Labs  Lab 04/10/24 0827 04/10/24 1331 04/11/24 0245  WBC 6.5 7.4 12.4*   Microbiology No results found for this or any previous visit (from the past 240 hours).   Time coordinating discharge:  I have spent 35 minutes face to face with the patient and on the ward discussing the patients care, assessment, plan and disposition with other care givers. >50% of the time was devoted counseling the patient about the risks and benefits of treatment/Discharge disposition and coordinating care.   SIGNED:   Maggie Schooner, MD  [facility] 04/11/2024, 8:55 AM   If 7PM-7AM, please contact night-coverage

## 2024-04-11 NOTE — Hospital Course (Addendum)
 Brief Narrative:  38 y.o. 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  On the day of admission around 4 AM woke up with diffuse body rash and itching and later in the day after she got out of shower her entire face eyes and lips were swollen.  She called [facility] and came to the hospital.  No identifying factors. No obvious source, CRP minimally elevated.  She is feeling significantly better and almost back to her baseline without any respiratory compromise or stridor.  Initially in the ER received epinephrine, Solu-Medrol and Pepcid.  Will transition to p.o. prednisone , p.o. Pepcid and Claritin .  Will also advised her to take p.o. Benadryl  as needed.  I have advised her to follow-up outpatient allergy immunology.  Today she is medically stable for discharge   Assessment & Plan:  Principal Problem:   Angioedema    Presumed allergic reaction H/o PCN and ?soybean allergy -No obvious source, CRP minimally elevated.  She is feeling significantly better and almost back to her baseline without any respiratory compromise or stridor.  Initially in the ER received epinephrine, Solu-Medrol and Pepcid.  Will transition to p.o. prednisone , p.o. Pepcid and Claritin .  Will also advised her to take p.o. Benadryl  as needed.  I have advised her to follow-up outpatient allergy immunology.    DVT prophylaxis: enoxaparin  (LOVENOX ) injection 40 mg Start: 04/10/24 1345      Code Status: Full Code Family Communication:   Discharge today    Subjective: Feeling significantly well wishing to go home.   Examination:  General exam: Appears calm and comfortable  Respiratory system: Clear to auscultation. Respiratory effort normal. Cardiovascular system: S1 & S2 heard, RRR. No JVD, murmurs, rubs, gallops or clicks. No pedal edema. Gastrointestinal system: Abdomen is nondistended, soft and nontender. No organomegaly or masses felt.  Normal bowel sounds heard. Central nervous system: Alert and oriented. No focal neurological deficits. Extremities: Symmetric 5 x 5 power. Skin: No rashes, lesions or ulcers Psychiatry: Judgement and insight appear normal. Mood & affect appropriate.

## 2024-04-11 NOTE — Plan of Care (Signed)

## 2024-04-11 NOTE — Progress Notes (Signed)
 Discharge instructions reviewed with pt and a family member at the bedside.   Copy of instructions given to pt. [facility] filling scripts and will be picked up on the way out for discharge.   Pt states she was told by the ER that she should get an epi pen to keep with her at all times at least until she and her PCP can determine what is triggering her allergic reactions. Will reach out to MD to inquire about a script for an epi pen before pt is discharged.  Pt will be d/c'd via wheelchair with belongings and will be escorted by staff/volunteer.  Nupur Hohman,RN SWOT

## 2024-04-21 ENCOUNTER — Other Ambulatory Visit (HOSPITAL_COMMUNITY): Payer: Self-pay

## 2024-04-25 ENCOUNTER — Other Ambulatory Visit (HOSPITAL_COMMUNITY): Payer: Self-pay

## 2024-04-25 ENCOUNTER — Emergency Department (HOSPITAL_COMMUNITY)
Admission: EM | Admit: 2024-04-25 | Discharge: 2024-04-25 | Disposition: A | Discharge status: Home / Self Care | Attending: Emergency Medicine | Admitting: Emergency Medicine

## 2024-04-25 ENCOUNTER — Other Ambulatory Visit: Payer: Self-pay

## 2024-04-25 DIAGNOSIS — T7840XA Allergy, unspecified, initial encounter: Secondary | ICD-10-CM | POA: Diagnosis present

## 2024-04-25 MED ORDER — FAMOTIDINE IN NACL 20-0.9 MG/50ML-% IV SOLN
20.0000 mg | Freq: Once | INTRAVENOUS | Status: AC
  Administered 2024-04-25: 20 mg via INTRAVENOUS
  Filled 2024-04-25: qty 50

## 2024-04-25 MED ORDER — DIPHENHYDRAMINE HCL 50 MG/ML IJ SOLN
25.0000 mg | Freq: Once | INTRAMUSCULAR | Status: AC
  Administered 2024-04-25: 25 mg via INTRAVENOUS
  Filled 2024-04-25: qty 1

## 2024-04-25 MED ORDER — EPINEPHRINE 0.3 MG/0.3ML IJ SOAJ
0.3000 mg | INTRAMUSCULAR | 0 refills | Status: DC | PRN
  Filled 2024-04-25: qty 2, 7d supply, fill #0

## 2024-04-25 MED ORDER — PREDNISONE 20 MG PO TABS
60.0000 mg | ORAL_TABLET | Freq: Once | ORAL | Status: DC
  Filled 2024-04-25: qty 3

## 2024-04-25 MED ORDER — FAMOTIDINE 20 MG PO TABS
20.0000 mg | ORAL_TABLET | Freq: Two times a day (BID) | ORAL | 0 refills | Status: DC
  Filled 2024-04-25: qty 8, 4d supply, fill #0

## 2024-04-25 MED ORDER — METHYLPREDNISOLONE SODIUM SUCC 125 MG IJ SOLR
125.0000 mg | Freq: Once | INTRAMUSCULAR | Status: AC
  Administered 2024-04-25: 125 mg via INTRAVENOUS
  Filled 2024-04-25: qty 2

## 2024-04-25 MED ORDER — DIPHENHYDRAMINE HCL 25 MG PO TABS: 25.0000 mg | ORAL_TABLET | Freq: Four times a day (QID) | ORAL | Status: AC | PRN

## 2024-04-25 NOTE — ED Notes (Signed)
 Complaining of increased difficulty breathing. No swelling noted. Throat sounded clear with no stridor noted. Lung sounds were normal on the left but diminished on the right to this paramedic. Spoke with pt nurse to double check

## 2024-04-25 NOTE — ED Triage Notes (Signed)
 Pt states she started having facial swelling, rash, itching x 30 minutes. Throat irritation & harder to swallow.  Pt states she ate some shrimp about 30 minutes ago.

## 2024-04-25 NOTE — ED Provider Notes (Signed)
 [place] EMERGENCY DEPARTMENT AT [facility] Provider Note  CSN: [id number] Arrival date & time: 04/25/24 0031  Chief Complaint(s) Allergic Reaction  HPI Christy [name] is a 38 y.o. female     Allergic Reaction Presenting symptoms: difficulty swallowing, itching, rash and swelling   Presenting symptoms: no difficulty breathing and no wheezing   Severity:  Moderate Duration:  2 hours Prior episodes: possible soy product. Context: food   Relieved by:  None tried   Past Medical History Past Medical History:  Diagnosis Date   Anemia    Counseling for birth control, oral contraceptives 11/29/2023   Diarrhea during pregnancy 07/16/2018   Nausea and vomiting of pregnancy, antepartum 03/04/2018   Preterm labor    Sickle cell trait [facility])    Supervision of other normal pregnancy, antepartum 02/25/2018    Nursing Staff Provider Office Location  [facility]  Dating  U/S Language  English Anatomy US   02-27-18 Flu Vaccine   Genetic Screen  NIPS:   AFP:   First Screen:  Quad:   TDaP vaccine   05-27-18 Hgb A1C or  GTT Early  Third trimester  Rhogam     LAB RESULTS  Feeding Plan Breast  Blood Type --/--/O POS, O POS Performed at [facility], [address], [place], [place] [address]  [phone]/29 1959)  Contra   Vaginal Pap smear, abnormal    Patient Active Problem List   Diagnosis Date Noted   Angioedema 04/10/2024   Abdominal colic 03/13/2024   Counseling for birth control, oral contraceptives 11/29/2023   Hip strain 11/29/2023   Muscle strain of lower leg 11/29/2023   Physical therapy evaluation, initial 11/29/2023   Muscle strain 11/29/2023   Sports physical 11/29/2023   Encounter for annual physical exam 11/29/2023   Activities involving soccer 11/29/2023   Allergic rhinitis 11/29/2023   Anemia 11/29/2023   Cardiomegaly 11/29/2023   Cough 11/29/2023   Encounter for issue of repeat prescription 11/29/2023   Excessive or frequent menstruation 11/29/2023    Gastroenteritis 11/29/2023   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   Myopia 11/29/2023   Insomnia 11/29/2023   Iron  deficiency anemia 11/29/2023   Migraine headache 11/29/2023   Nasal congestion 11/29/2023   Need for prophylactic vaccination with tetanus-diphtheria (Td) 11/29/2023   Need for varicella vaccine 11/29/2023   Need for prophylactic vaccination and inoculation against viral hepatitis 11/29/2023   Need for prophylactic vaccination with measles-mumps-rubella (MMR) vaccine 11/29/2023   Need for prophylactic vaccination and inoculation against poliomyelitis 11/29/2023   Pain in joint, ankle and foot 11/29/2023   Pain in joint, lower leg 11/29/2023   Pain in soft tissues of limb 11/29/2023   Postpartum mood disturbance 11/29/2023   Pre-operative examination 11/29/2023   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/29/2023   Screening examination for pulmonary tuberculosis 11/29/2023   Sickle cell trait (HCC) 11/29/2023   Sinusitis 11/29/2023   Health examination of defined subpopulation 11/29/2023   Examination of eyes and vision 11/29/2023   Counseling on injury prevention 11/29/2023   Other specified counseling 11/29/2023   Dietary counseling and surveillance 11/29/2023   Abdominal pain 11/29/2023   Back muscle spasm 11/29/2023   Backache 11/29/2023   Lower back pain 11/29/2023   Low back pain 11/29/2023   Pelvic pain 05/28/2022   Premature rupture of membranes 07/23/2018   Group beta Strep positive 07/20/2018   Abdominal pain affecting pregnancy 07/16/2018   Previous cesarean delivery affecting pregnancy    Previous preterm delivery, antepartum, second trimester  Obesity affecting pregnancy in second trimester    Encounter for other administrative examinations 12/08/2015   Home Medication(s) Prior to Admission medications   Medication Sig Start Date End Date Taking? Authorizing Provider  acetaminophen  (TYLENOL ) 500 MG tablet Take 500 mg by mouth every 6 (six) hours as  needed. 03/25/23   [provider]  albuterol  (VENTOLIN  HFA) 108 (90 Base) MCG/ACT inhaler Inhale 1-2 puffs into the lungs every 6 (six) hours as needed for wheezing or shortness of breath. 10/03/22   Dodson Freestone, FNP  cyclobenzaprine  (FLEXERIL ) 10 MG tablet Take 10 mg by mouth as needed for muscle spasms. 03/25/23   [provider]  diphenhydrAMINE  (BENADRYL ) 25 MG tablet Take 1 tablet (25 mg total) by mouth every 6 (six) hours as needed for itching. 04/25/24   Danford Tat, Camila Cecil, MD  EPINEPHrine  0.3 mg/0.3 mL IJ SOAJ injection Inject 0.3 mg into the muscle as needed for anaphylaxis. 04/25/24   Lindle Rhea, MD  famotidine  (PEPCID ) 20 MG tablet Take 1 tablet (20 mg total) by mouth 2 (two) times daily for 4 days. 04/25/24 04/29/24  Lindle Rhea, MD  ibuprofen  (ADVIL ) 400 MG tablet Take 400 mg by mouth as needed for moderate pain (pain score 4-6). 03/25/23   [provider]  loratadine  (CLARITIN ) 10 MG tablet Take 1 tablet (10 mg total) by mouth daily for 5 days. 04/11/24 04/16/24  Amin, Ankit C, MD  furosemide  (LASIX ) 20 MG tablet Take 1 tablet (20 mg total) by mouth daily. Patient not taking: Reported on 09/05/2018 07/30/18 03/20/21  Alissa April, MD                                                                                                                                    Allergies Soybean oil, Amoxicillin, Penicillin v potassium, and Penicillins  Review of Systems Review of Systems  HENT:  Positive for trouble swallowing.   Respiratory:  Negative for wheezing.   Skin:  Positive for itching and rash.   As noted in HPI  Physical Exam Vital Signs  I have reviewed the triage vital signs BP (!) 104/59   Pulse 84   Temp 98 F (36.7 C) (Oral)   Resp 20   Ht 5' 5 (1.651 m)   Wt 77.1 kg   LMP  (LMP Unknown)   SpO2 98%   BMI 28.29 kg/m   Physical Exam Vitals reviewed.  Constitutional:      General: She is not in acute distress.     Appearance: She is well-developed. She is not diaphoretic.  HENT:     Head: Normocephalic and atraumatic.     Nose: Nose normal.   Eyes:     General: No scleral icterus.       Right eye: No discharge.        Left eye: No discharge.     Conjunctiva/sclera: Conjunctivae normal.  Pupils: Pupils are equal, round, and reactive to light.    Cardiovascular:     Rate and Rhythm: Normal rate and regular rhythm.     Heart sounds: No murmur heard.    No friction rub. No gallop.  Pulmonary:     Effort: Pulmonary effort is normal. No respiratory distress.     Breath sounds: Normal breath sounds. No stridor. No rales.  Abdominal:     General: There is no distension.     Palpations: Abdomen is soft.     Tenderness: There is no abdominal tenderness.   Musculoskeletal:        General: No tenderness.     Cervical back: Normal range of motion and neck supple.   Skin:    General: Skin is warm and dry.     Findings: Rash present. No erythema. Rash is urticarial (to face).   Neurological:     Mental Status: She is alert and oriented to person, place, and time.     ED Results and Treatments Labs (all labs ordered are listed, but only abnormal results are displayed) Labs Reviewed - No data to display                                                                                                                       EKG  EKG Interpretation Date/Time:    Ventricular Rate:    PR Interval:    QRS Duration:    QT Interval:    QTC Calculation:   R Axis:      Text Interpretation:         Radiology No results found.  Medications Ordered in ED Medications  diphenhydrAMINE  (BENADRYL ) injection 25 mg (25 mg Intravenous Given 04/25/24 0145)  famotidine  (PEPCID ) IVPB 20 mg premix (0 mg Intravenous Stopped 04/25/24 0315)  methylPREDNISolone  sodium succinate (SOLU-MEDROL ) 125 mg/2 mL injection 125 mg (125 mg Intravenous Given 04/25/24 0146)   Procedures Procedures  (including critical  care time) Medical Decision Making / ED Course   Medical Decision Making Risk Prescription drug management.    38 y.o. female here with pruritic rash. No known triggers or exposures. No respiratory, GI, or neurologic symptoms to suggest anaphylaxis. Patient has not taken benadryl  prior to arrival.   On exam, there is no evidence of oral swelling or airway compromise.   Given Benadryl , H2 blocker, and steroids. Monitored for 3.5hrs. Symptoms resolved  Safe for discharge with strict return precautions. Given Rx for H2 blocker.     Final Clinical Impression(s) / ED Diagnoses Final diagnoses:  Allergic reaction, initial encounter   The patient appears reasonably screened and/or stabilized for discharge and I doubt any other medical condition or other [facility] requiring further screening, evaluation, or treatment in the ED at this time. I have discussed the findings, Dx and Tx plan with the patient/family who expressed understanding and agree(s) with the plan. Discharge instructions discussed at length. The patient/family was given strict return precautions who verbalized understanding of the instructions.  No further questions at time of discharge.  Disposition: Discharge  Condition: Good  ED Discharge Orders          Ordered    EPINEPHrine  0.3 mg/0.3 mL IJ SOAJ injection  As needed        04/25/24 0419    famotidine  (PEPCID ) 20 MG tablet  2 times daily        04/25/24 0419    diphenhydrAMINE  (BENADRYL ) 25 MG tablet  Every 6 hours PRN        04/25/24 0419             Follow Up: Jonathon Neighbors, MD [address] [place], [address] [place] [address] [phone]  Call  to schedule an appointment for close follow up    This chart was dictated using voice recognition software.  Despite best efforts to proofread,  errors can occur which can change the documentation meaning.    Lindle Rhea, MD 04/25/24 [phone]

## 2024-04-26 ENCOUNTER — Emergency Department (HOSPITAL_COMMUNITY)
Admission: EM | Admit: 2024-04-26 | Discharge: 2024-04-26 | Disposition: A | Discharge status: Home / Self Care | Attending: Student | Admitting: Student

## 2024-04-26 ENCOUNTER — Encounter (HOSPITAL_COMMUNITY): Payer: Self-pay

## 2024-04-26 ENCOUNTER — Other Ambulatory Visit: Payer: Self-pay

## 2024-04-26 ENCOUNTER — Other Ambulatory Visit (HOSPITAL_COMMUNITY): Payer: Self-pay

## 2024-04-26 DIAGNOSIS — R220 Localized swelling, mass and lump, head: Secondary | ICD-10-CM | POA: Insufficient documentation

## 2024-04-26 DIAGNOSIS — T7840XA Allergy, unspecified, initial encounter: Secondary | ICD-10-CM | POA: Insufficient documentation

## 2024-04-26 DIAGNOSIS — R0789 Other chest pain: Secondary | ICD-10-CM | POA: Insufficient documentation

## 2024-04-26 MED ORDER — METHYLPREDNISOLONE 4 MG PO TBPK: ORAL_TABLET | ORAL | 0 refills | Status: DC

## 2024-04-26 MED ORDER — EPINEPHRINE 0.3 MG/0.3ML IJ SOAJ
0.3000 mg | Freq: Once | INTRAMUSCULAR | Status: AC
  Administered 2024-04-26: 0.3 mg via INTRAMUSCULAR
  Filled 2024-04-26: qty 0.3

## 2024-04-26 MED ORDER — FAMOTIDINE 20 MG PO TABS
20.0000 mg | ORAL_TABLET | Freq: Once | ORAL | Status: AC
  Administered 2024-04-26: 20 mg via ORAL
  Filled 2024-04-26: qty 1

## 2024-04-26 MED ORDER — PREDNISONE 5 MG PO TABS
50.0000 mg | ORAL_TABLET | Freq: Once | ORAL | Status: AC
  Administered 2024-04-26: 50 mg via ORAL
  Filled 2024-04-26: qty 3

## 2024-04-26 MED ORDER — DIPHENHYDRAMINE HCL 50 MG/ML IJ SOLN
25.0000 mg | Freq: Once | INTRAMUSCULAR | Status: AC
  Administered 2024-04-26: 25 mg via INTRAMUSCULAR
  Filled 2024-04-26: qty 1

## 2024-04-26 NOTE — ED Notes (Signed)
 EDP at [facility]

## 2024-04-26 NOTE — ED Provider Notes (Signed)
 [place] EMERGENCY DEPARTMENT AT [facility] Provider Note  CSN: [id number] Arrival date & time: 04/26/24 1042  Chief Complaint(s) Allergic Reaction and Back Pain  HPI Christy [name] is a 38 y.o. female with PMH anemia, sickle cell trait, recent hospital admission on 04/10/2024 for angioedema versus allergic reaction who presents emergency room for evaluation of facial swelling, chest tightness throat swelling.  Patient was seen last night after eating some sort of cream sauce and received steroids, Benadryl  and Pepcid  but did not receive an EpiPen .  She states that she felt improved and was ultimately discharged.  However, her symptoms returned today and she returned to the hospital.  She has not been able to follow-up with an allergist.    Past Medical History Past Medical History:  Diagnosis Date   Anemia    Counseling for birth control, oral contraceptives 11/29/2023   Diarrhea during pregnancy 07/16/2018   Nausea and vomiting of pregnancy, antepartum 03/04/2018   Preterm labor    Sickle cell trait [facility])    Supervision of other normal pregnancy, antepartum 02/25/2018    Nursing Staff Provider Office Location  [facility]  Dating  U/S Language  English Anatomy US   02-27-18 Flu Vaccine   Genetic Screen  NIPS:   AFP:   First Screen:  Quad:   TDaP vaccine   05-27-18 Hgb A1C or  GTT Early  Third trimester  Rhogam     LAB RESULTS  Feeding Plan Breast  Blood Type --/--/O POS, O POS Performed at [facility], [address], [place], [place] [address]  [phone]/29 1957)  Contra   Vaginal Pap smear, abnormal    Patient Active Problem List   Diagnosis Date Noted   Angioedema 04/10/2024   Abdominal colic 03/13/2024   Counseling for birth control, oral contraceptives 11/29/2023   Hip strain 11/29/2023   Muscle strain of lower leg 11/29/2023   Physical therapy evaluation, initial 11/29/2023   Muscle strain 11/29/2023   Sports physical 11/29/2023   Encounter for annual physical exam  11/29/2023   Activities involving soccer 11/29/2023   Allergic rhinitis 11/29/2023   Anemia 11/29/2023   Cardiomegaly 11/29/2023   Cough 11/29/2023   Encounter for issue of repeat prescription 11/29/2023   Excessive or frequent menstruation 11/29/2023   Gastroenteritis 11/29/2023   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   Myopia 11/29/2023   Insomnia 11/29/2023   Iron  deficiency anemia 11/29/2023   Migraine headache 11/29/2023   Nasal congestion 11/29/2023   Need for prophylactic vaccination with tetanus-diphtheria (Td) 11/29/2023   Need for varicella vaccine 11/29/2023   Need for prophylactic vaccination and inoculation against viral hepatitis 11/29/2023   Need for prophylactic vaccination with measles-mumps-rubella (MMR) vaccine 11/29/2023   Need for prophylactic vaccination and inoculation against poliomyelitis 11/29/2023   Pain in joint, ankle and foot 11/29/2023   Pain in joint, lower leg 11/29/2023   Pain in soft tissues of limb 11/29/2023   Postpartum mood disturbance 11/29/2023   Pre-operative examination 11/29/2023   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/29/2023   Screening examination for pulmonary tuberculosis 11/29/2023   Sickle cell trait (HCC) 11/29/2023   Sinusitis 11/29/2023   Health examination of defined subpopulation 11/29/2023   Examination of eyes and vision 11/29/2023   Counseling on injury prevention 11/29/2023   Other specified counseling 11/29/2023   Dietary counseling and surveillance 11/29/2023   Abdominal pain 11/29/2023   Back muscle spasm 11/29/2023   Backache 11/29/2023   Lower back pain 11/29/2023   Low back pain 11/29/2023  Pelvic pain 05/28/2022   Premature rupture of membranes 07/23/2018   Group beta Strep positive 07/20/2018   Abdominal pain affecting pregnancy 07/16/2018   Previous cesarean delivery affecting pregnancy    Previous preterm delivery, antepartum, second trimester    Obesity affecting pregnancy in second trimester     Encounter for other administrative examinations 12/08/2015   Home Medication(s) Prior to Admission medications   Medication Sig Start Date End Date Taking? Authorizing Provider  methylPREDNISolone  (MEDROL  DOSEPAK) 4 MG TBPK tablet Take as prescribed 04/26/24  Yes Dorcus Riga, MD  acetaminophen  (TYLENOL ) 500 MG tablet Take 500 mg by mouth every 6 (six) hours as needed. 03/25/23   [provider]  albuterol  (VENTOLIN  HFA) 108 (90 Base) MCG/ACT inhaler Inhale 1-2 puffs into the lungs every 6 (six) hours as needed for wheezing or shortness of breath. 10/03/22   Hazen Darryle BRAVO, FNP  cyclobenzaprine  (FLEXERIL ) 10 MG tablet Take 10 mg by mouth as needed for muscle spasms. 03/25/23   [provider]  diphenhydrAMINE  (BENADRYL ) 25 MG tablet Take 1 tablet (25 mg total) by mouth every 6 (six) hours as needed for itching. 04/25/24   Cardama, Raynell Moder, MD  EPINEPHrine  0.3 mg/0.3 mL IJ SOAJ injection Inject 0.3 mg into the muscle as needed for anaphylaxis. 04/25/24   Trine Raynell Moder, MD  famotidine  (PEPCID ) 20 MG tablet Take 1 tablet (20 mg total) by mouth 2 (two) times daily for 4 days. 04/25/24 04/30/24  Trine Raynell Moder, MD  ibuprofen  (ADVIL ) 400 MG tablet Take 400 mg by mouth as needed for moderate pain (pain score 4-6). 03/25/23   [provider]  loratadine  (CLARITIN ) 10 MG tablet Take 1 tablet (10 mg total) by mouth daily for 5 days. 04/11/24 04/16/24  Amin, Ankit C, MD  furosemide  (LASIX ) 20 MG tablet Take 1 tablet (20 mg total) by mouth daily. Patient not taking: Reported on 09/05/2018 07/30/18 03/20/21  Raford Lenis, MD                                                                                                                                    Past Surgical History Past Surgical History:  Procedure Laterality Date   CESAREAN SECTION     DILATION AND CURETTAGE OF UTERUS N/A 05/28/2022   Procedure: DILATATION AND CURETTAGE WITH INTRAOPERATIVE ULTRASOUND;   Surgeon: Ozan, Jennifer, DO;  Location: [facility] OR;  Service: Gynecology;  Laterality: N/A;   FIBULA FRACTURE SURGERY     TIBIA FRACTURE SURGERY     VULVA SURGERY     Hematoma   WISDOM TOOTH EXTRACTION     Family History Family History  Problem Relation Age of Onset   Diabetes Mother    Diabetes Brother    Diabetes Maternal Aunt    Kidney disease Maternal Aunt    Diabetes Paternal Grandmother    Dementia Paternal Grandmother     Social History Social History  Tobacco Use   Smoking status: Never   Smokeless tobacco: Never  Vaping Use   Vaping status: Never Used  Substance Use Topics   Alcohol use: Never   Drug use: Never   Allergies Soybean oil, Amoxicillin, Penicillin v potassium, and Penicillins  Review of Systems Review of Systems  HENT:  Positive for facial swelling and sore throat.   Respiratory:  Positive for chest tightness.     Physical Exam Vital Signs  I have reviewed the triage vital signs BP 127/72   Pulse 97   Temp 98.8 F (37.1 C)   Resp 20   Ht 5' 5 (1.651 m)   Wt 77.1 kg   LMP 03/31/2024   SpO2 99%   BMI 28.29 kg/m   Physical Exam Vitals and nursing note reviewed.  Constitutional:      General: She is not in acute distress.    Appearance: She is well-developed.  HENT:     Head: Normocephalic and atraumatic.     Comments: Facial swelling  Eyes:     Conjunctiva/sclera: Conjunctivae normal.    Cardiovascular:     Rate and Rhythm: Normal rate and regular rhythm.     Heart sounds: No murmur heard. Pulmonary:     Effort: Pulmonary effort is normal. No respiratory distress.     Breath sounds: Normal breath sounds.  Abdominal:     Palpations: Abdomen is soft.     Tenderness: There is no abdominal tenderness.   Musculoskeletal:        General: No swelling.     Cervical back: Neck supple.   Skin:    General: Skin is warm and dry.     Capillary Refill: Capillary refill takes less than 2 seconds.   Neurological:     Mental Status:  She is alert.   Psychiatric:        Mood and Affect: Mood normal.     ED Results and Treatments Labs (all labs ordered are listed, but only abnormal results are displayed) Labs Reviewed  C1 ESTERASE INHIBITOR                                                                                                                          Radiology No results found.  Pertinent labs & imaging results that were available during my care of the patient were reviewed by me and considered in my medical decision making (see MDM for details).  Medications Ordered in ED Medications  predniSONE  (DELTASONE ) tablet 50 mg (50 mg Oral Given 04/26/24 1129)  EPINEPHrine  (EPI-PEN) injection 0.3 mg (0.3 mg Intramuscular Given 04/26/24 1214)  diphenhydrAMINE  (BENADRYL ) injection 25 mg (25 mg Intramuscular Given 04/26/24 1214)  famotidine  (PEPCID ) tablet 20 mg (20 mg Oral Given 04/26/24 1214)  Procedures .Critical Care  Performed by: Albertina Dixon, MD Authorized by: Albertina Dixon, MD   Critical care provider statement:    Critical care time (minutes):  30   Critical care was necessary to treat or prevent imminent or life-threatening deterioration of the following conditions: Allergic reaction requiring epinephrine .   Critical care was time spent personally by me on the following activities:  Development of treatment plan with patient or surrogate, discussions with consultants, evaluation of patient's response to treatment, examination of patient, ordering and review of laboratory studies, ordering and review of radiographic studies, ordering and performing treatments and interventions, pulse oximetry, re-evaluation of patient's condition and review of old charts   (including critical care time)  Medical Decision Making / ED Course   This patient presents to the ED for concern  of facial swelling, chest tightness, this involves an extensive number of treatment options, and is a complaint that carries with it a high risk of complications and morbidity.  The differential diagnosis includes anaphylaxis, angioedema, allergic reaction, fluid overload  MDM: Patient seen emerged part for evaluation of facial swelling and chest tightness.  Physical exam with facial swelling and periorbital edema but exam otherwise unremarkable.  No hives seen on skin exam, no wheezing on lung exam, no oropharyngeal swelling.  Patient had appropriate therapy in the ER last night but symptoms have persisted and in the setting of her sensation of chest tightness and facial swelling reassured decision making and patient is requesting an EpiPen .  This is not unreasonable and thus she was given intramuscular epinephrine  as well as oral prednisone , Pepcid  and diphenhydramine .  Observed in the emergency department for 3 hours and on reevaluation symptoms have significant proved and not returned.  At this time she does not meet inpatient criteria for admission and will be discharged with outpatient follow-up.  I placed an outpatient allergy follow-up.  I did order a C1 esterase inhibitor to help assist the allergy team but it appears that this was not obtained prior to discharge.  This can be obtained in the outpatient setting if the allergy team feels this is necessary.   Additional history obtained: -External records from outside source obtained and reviewed including: Chart review including previous notes, labs, imaging, consultation notes    Imaging Studies ordered: I ordered imaging studies including so there li I independently visualized and interpreted imaging. I agree with the radiologist interpretation   Medicines ordered and prescription drug management: Meds ordered this encounter  Medications   predniSONE  (DELTASONE ) tablet 50 mg   EPINEPHrine  (EPI-PEN) injection 0.3 mg   diphenhydrAMINE   (BENADRYL ) injection 25 mg   famotidine  (PEPCID ) tablet 20 mg   methylPREDNISolone  (MEDROL  DOSEPAK) 4 MG TBPK tablet    Sig: Take as prescribed    Dispense:  1 each    Refill:  0    -I have reviewed the patients home medicines and have made adjustments as needed  Critical interventions Epinephrine , steroids, Benadryl    Cardiac Monitoring: The patient was maintained on a cardiac monitor.  I personally viewed and interpreted the cardiac monitored which showed an underlying rhythm of: NSR  Social Determinants of Health:  Factors impacting patients care include: none   Reevaluation: After the interventions noted above, I reevaluated the patient and found that they have :improved  Co morbidities that complicate the patient evaluation  Past Medical History:  Diagnosis Date   Anemia    Counseling for birth control, oral contraceptives 11/29/2023   Diarrhea during pregnancy 07/16/2018  Nausea and vomiting of pregnancy, antepartum 03/04/2018   Preterm labor    Sickle cell trait [facility])    Supervision of other normal pregnancy, antepartum 02/25/2018    Nursing Staff Provider Office Location  [facility]  Dating  U/S Language  English Anatomy US   02-27-18 Flu Vaccine   Genetic Screen  NIPS:   AFP:   First Screen:  Quad:   TDaP vaccine   05-27-18 Hgb A1C or  GTT Early  Third trimester  Rhogam     LAB RESULTS  Feeding Plan Breast  Blood Type --/--/O POS, MALVA POS Performed at [facility], [address], [place], [place] [address]  (04/29 1957)  Contra   Vaginal Pap smear, abnormal       Dispostion: I considered admission for this patient, but at this time she does not meet inpatient criteria for admission or be discharged with outpatient follow-up.     Final Clinical Impression(s) / ED Diagnoses Final diagnoses:  Facial swelling  Allergic reaction, initial encounter     @PCDICTATION @    Albertina Dixon, MD 04/26/24 [phone]

## 2024-04-26 NOTE — ED Triage Notes (Addendum)
 Pt. Stated, I had some allergic reaction of hives NS SWELLING. Yesterday I had some scratching , I went to [facility] and I was given an IV with some Pepcid , Steroid , and Benadryl  and sent me home. Last time I had an allergic reaction they gave me an epi pen. Pt. Stated, I feel like the sides of my throat ar swollen, denies difficulty swallowing. Pt has not taken any Medication since leaving [facility]. I had an allergic test last Friday and they told me to come back in a week. I had another allergic reaction a week ago before yesterday.Pt. stated, My back feels itchy and my legs feel like they are swelling more.

## 2024-04-26 NOTE — ED Provider Triage Note (Signed)
 Emergency Medicine Provider Triage Evaluation Note  Christy [name] , a 38 y.o. female  was evaluated in triage.  Pt complains of continued allergic symptoms after having been seen in [place] [facility] for the same about 36 hours prior.  She was given a course of IV famotidine , diphenhydramine , and corticosteroids with initial improvement, discharged without any outpatient prescriptions.  Over the last 24 hours has progressively worsened, having facial edema and skin itching, redness.  States that she has some initial shortness of breath but does not feel that she has any trouble breathing at this time..  Review of Systems  Positive: Facial swelling, itching, skin rash. Negative: Dyspnea  Physical Exam  BP 130/81   Pulse 78   Temp 98.8 F (37.1 C)   Resp 16   Ht 5' 5 (1.651 m)   Wt 77.1 kg   LMP 03/31/2024   SpO2 100%   BMI 28.29 kg/m  Gen:   Awake, no distress   Resp:  Normal effort , good air entry into all lung fields without any adventitious sounds noted.  Oropharynx is patent without any laryngeal edema noted. MSK:   Moves extremities without difficulty  Other:  There is marked facial edema in the left zygoma, further has erythema of the bilateral thighs and pruritic rash to the bilateral upper arms.  Medical Decision Making  Medically screening exam initiated at 11:24 AM.  Appropriate orders placed.  Christy [name] was informed that the remainder of the evaluation will be completed by another provider, this initial triage assessment does not replace that evaluation, and the importance of remaining in the ED until their evaluation is complete.  Based on clinical evaluation of this patient, will begin treatment for allergic reaction with oral prednisone , no further workup at this time given her presentation.  She is not in any respiratory distress, she does not have any laryngeal edema, good air entry into all lung fields without any adventitious sounds noted.     Christy [name], [name] 04/26/24 1128

## 2024-04-29 ENCOUNTER — Other Ambulatory Visit (HOSPITAL_COMMUNITY): Payer: Self-pay

## 2024-05-14 ENCOUNTER — Emergency Department (HOSPITAL_COMMUNITY)
Admission: EM | Admit: 2024-05-14 | Discharge: 2024-05-15 | Disposition: A | Discharge status: Home / Self Care | Attending: Emergency Medicine | Admitting: Emergency Medicine

## 2024-05-14 ENCOUNTER — Other Ambulatory Visit: Payer: Self-pay

## 2024-05-14 ENCOUNTER — Encounter (HOSPITAL_COMMUNITY): Payer: Self-pay | Admitting: *Deleted

## 2024-05-14 DIAGNOSIS — T7840XA Allergy, unspecified, initial encounter: Secondary | ICD-10-CM | POA: Diagnosis present

## 2024-05-14 MED ORDER — DIPHENHYDRAMINE HCL 25 MG PO CAPS
50.0000 mg | ORAL_CAPSULE | Freq: Once | ORAL | Status: AC
  Administered 2024-05-14: 50 mg via ORAL
  Filled 2024-05-14: qty 2

## 2024-05-14 NOTE — ED Triage Notes (Signed)
 The pt has  hives  since yesterday  hives again today and  she has a swollen face  lmp June 14th

## 2024-05-15 MED ORDER — PREDNISONE 20 MG PO TABS: ORAL_TABLET | ORAL | 0 refills | Status: DC

## 2024-05-15 MED ORDER — EPINEPHRINE 0.3 MG/0.3ML IJ SOAJ: 0.3000 mg | INTRAMUSCULAR | 0 refills | Status: DC | PRN

## 2024-05-15 MED ORDER — PREDNISONE 20 MG PO TABS
60.0000 mg | ORAL_TABLET | Freq: Once | ORAL | Status: AC
  Administered 2024-05-15: 60 mg via ORAL
  Filled 2024-05-15: qty 3

## 2024-05-15 NOTE — ED Provider Notes (Signed)
 [place] EMERGENCY DEPARTMENT AT [facility] Provider Note   CSN: [id number] Arrival date & time: 05/14/24  2211     Patient presents with: Allergic Reaction   Christy [name] is a 38 y.o. female.   The history is provided by the patient and medical records.  Allergic Reaction Presenting symptoms: rash    38 y.o. F here with allergic reaction.  States broke out in hives yesterday evening from unknown cause.  States she has been itchy but today started to have some facial swelling as well starting around 6pm.  Denies any difficulty swallowing, SOB, abdominal pain, nausea, vomiting.  States reaction to soy in the past but did not have any of that today.  States she has tried to set up appt with allergist for formal testing but never able to get ahold of anyone.  Only known allergy is penicillins.  No new soaps, detergents, or foods.  Given benadryl  in triage.  Prior to Admission medications   Medication Sig Start Date End Date Taking? Authorizing Provider  EPINEPHrine  0.3 mg/0.3 mL IJ SOAJ injection Inject 0.3 mg into the muscle as needed for anaphylaxis. 05/15/24  Yes Jarold Olam HERO, PA-C  predniSONE  (DELTASONE ) 20 MG tablet Take 40 mg by mouth daily for 3 days, then 20mg  by mouth daily for 3 days, then 10mg  daily for 3 days 05/15/24  Yes Jarold Olam HERO, PA-C  acetaminophen  (TYLENOL ) 500 MG tablet Take 500 mg by mouth every 6 (six) hours as needed. 03/25/23   [provider]  albuterol  (VENTOLIN  HFA) 108 (90 Base) MCG/ACT inhaler Inhale 1-2 puffs into the lungs every 6 (six) hours as needed for wheezing or shortness of breath. 10/03/22   Hazen Darryle BRAVO, FNP  cyclobenzaprine  (FLEXERIL ) 10 MG tablet Take 10 mg by mouth as needed for muscle spasms. 03/25/23   [provider]  diphenhydrAMINE  (BENADRYL ) 25 MG tablet Take 1 tablet (25 mg total) by mouth every 6 (six) hours as needed for itching. 04/25/24   Cardama, Raynell Moder, MD  famotidine  (PEPCID ) 20 MG  tablet Take 1 tablet (20 mg total) by mouth 2 (two) times daily for 4 days. 04/25/24 04/30/24  Trine Raynell Moder, MD  ibuprofen  (ADVIL ) 400 MG tablet Take 400 mg by mouth as needed for moderate pain (pain score 4-6). 03/25/23   [provider]  loratadine  (CLARITIN ) 10 MG tablet Take 1 tablet (10 mg total) by mouth daily for 5 days. 04/11/24 04/16/24  Caleen Burgess BROCKS, MD  methylPREDNISolone  (MEDROL  DOSEPAK) 4 MG TBPK tablet Take as prescribed 04/26/24   Kommor, Madison, MD  furosemide  (LASIX ) 20 MG tablet Take 1 tablet (20 mg total) by mouth daily. Patient not taking: Reported on 09/05/2018 07/30/18 03/20/21  Raford Lenis, MD    Allergies: Soybean oil, Amoxicillin, Penicillin v potassium, and Penicillins    Review of Systems  Skin:  Positive for rash.  All other systems reviewed and are negative.   Updated Vital Signs BP 119/66 (BP Location: Right Arm)   Pulse 74   Temp 98.6 F (37 C) (Oral)   Resp 14   Ht 5' 5 (1.651 m)   Wt 77.1 kg   LMP 03/31/2024   SpO2 100%   BMI 28.29 kg/m   Physical Exam Vitals and nursing note reviewed.  Constitutional:      Appearance: She is well-developed.  HENT:     Head: Normocephalic and atraumatic.     Mouth/Throat:     Comments: No lip/tongue swelling, handling  secretions well, no stridor Eyes:     Conjunctiva/sclera: Conjunctivae normal.     Pupils: Pupils are equal, round, and reactive to light.  Cardiovascular:     Rate and Rhythm: Normal rate and regular rhythm.     Heart sounds: Normal heart sounds.  Pulmonary:     Effort: Pulmonary effort is normal.     Breath sounds: Normal breath sounds.  Abdominal:     General: Bowel sounds are normal.     Palpations: Abdomen is soft.  Musculoskeletal:        General: Normal range of motion.     Cervical back: Normal range of motion.  Skin:    General: Skin is warm and dry.     Comments: Very faint, scattered hives on arms, torso Face is a little puffy without discrete rash   Neurological:     Mental Status: She is alert and oriented to person, place, and time.     (all labs ordered are listed, but only abnormal results are displayed) Labs Reviewed - No data to display  EKG: None  Radiology: No results found.   Procedures   Medications Ordered in the ED  predniSONE  (DELTASONE ) tablet 60 mg (has no administration in time range)  diphenhydrAMINE  (BENADRYL ) capsule 50 mg (50 mg Oral Given 05/14/24 2239)                                    Medical Decision Making Risk Prescription drug management.   38 year old female here with hives and mild facial swelling.  Unclear etiology.  Has had reaction to soy in the past but denies ingestion of this recently.  No changes in soaps or detergents.  She has a very faint urticarial rash on exam.  Mild puffiness of the face but no lip or tongue swelling, handling secretions well, no stridor.  After talking with her, sounds like she has had some similar reactions recently.  She likely would benefit from follow-up with allergy center for formal testing.  Will continue prednisone  taper and benadryl , prescription given for EpiPen  as well.  Given information for local allergy clinics.  Follow-up with PCP in the interim.  Return here for new concerns.  Final diagnoses:  Allergic reaction, initial encounter    ED Discharge Orders          Ordered    predniSONE  (DELTASONE ) 20 MG tablet        05/15/24 0022    EPINEPHrine  0.3 mg/0.3 mL IJ SOAJ injection  As needed        05/15/24 0022               Jarold Olam HERO, PA-C 05/15/24 0026    Darra Fonda MATSU, MD 05/15/24 [phone]

## 2024-05-15 NOTE — Discharge Instructions (Signed)
 Take the prescribed medication as directed.  Keep epi pen with you at all times. Follow-up with your primary care doctor. You may also wish to follow up with allergist-- I have listed some local clinics that you can try to follow-up with. Return to the ED for new or worsening symptoms.

## 2024-05-16 ENCOUNTER — Encounter (HOSPITAL_COMMUNITY): Payer: Self-pay | Admitting: *Deleted

## 2024-05-16 ENCOUNTER — Other Ambulatory Visit: Payer: Self-pay

## 2024-05-16 ENCOUNTER — Emergency Department (HOSPITAL_COMMUNITY)
Admission: EM | Admit: 2024-05-16 | Discharge: 2024-05-17 | Disposition: A | Discharge status: Home / Self Care | Attending: Emergency Medicine | Admitting: Emergency Medicine

## 2024-05-16 DIAGNOSIS — L509 Urticaria, unspecified: Secondary | ICD-10-CM | POA: Diagnosis not present

## 2024-05-16 DIAGNOSIS — R079 Chest pain, unspecified: Secondary | ICD-10-CM | POA: Diagnosis present

## 2024-05-16 LAB — CBC
HCT: 33.4 % — ABNORMAL LOW (ref 36.0–46.0)
Hemoglobin: 11.2 g/dL — ABNORMAL LOW (ref 12.0–15.0)
MCH: 26.9 pg (ref 26.0–34.0)
MCHC: 33.5 g/dL (ref 30.0–36.0)
MCV: 80.3 fL (ref 80.0–100.0)
Platelets: 359 K/uL (ref 150–400)
RBC: 4.16 MIL/uL (ref 3.87–5.11)
RDW: 15.1 % (ref 11.5–15.5)
WBC: 10 K/uL (ref 4.0–10.5)
nRBC: 0 % (ref 0.0–0.2)

## 2024-05-16 LAB — COMPREHENSIVE METABOLIC PANEL WITH GFR
ALT: 22 U/L (ref 0–44)
AST: 19 U/L (ref 15–41)
Albumin: 3.1 g/dL — ABNORMAL LOW (ref 3.5–5.0)
Alkaline Phosphatase: 67 U/L (ref 38–126)
Anion gap: 5 (ref 5–15)
BUN: 6 mg/dL (ref 6–20)
CO2: 29 mmol/L (ref 22–32)
Calcium: 9.2 mg/dL (ref 8.9–10.3)
Chloride: 103 mmol/L (ref 98–111)
Creatinine, Ser: 0.68 mg/dL (ref 0.44–1.00)
GFR, Estimated: >60 mL/min (ref >60)
Glucose, Bld: 126 mg/dL — ABNORMAL HIGH (ref 70–99)
Potassium: 3.5 mmol/L (ref 3.5–5.1)
Sodium: 137 mmol/L (ref 135–145)
Total Bilirubin: <0.2 mg/dL (ref 0.0–1.2)
Total Protein: 7.4 g/dL (ref 6.5–8.1)

## 2024-05-16 LAB — TROPONIN I (HIGH SENSITIVITY): Troponin I (High Sensitivity): 3 ng/L (ref <18)

## 2024-05-16 LAB — URINALYSIS, ROUTINE W REFLEX MICROSCOPIC
Bacteria, UA: NONE SEEN
Bilirubin Urine: NEGATIVE
Glucose, UA: NEGATIVE mg/dL
Hgb urine dipstick: NEGATIVE
Ketones, ur: NEGATIVE mg/dL
Leukocytes,Ua: NEGATIVE
Nitrite: NEGATIVE
Protein, ur: NEGATIVE mg/dL
Specific Gravity, Urine: 1.016 (ref 1.005–1.030)
pH: 5 (ref 5.0–8.0)

## 2024-05-16 LAB — LIPASE, BLOOD: Lipase: 30 U/L (ref 11–51)

## 2024-05-16 LAB — HCG, SERUM, QUALITATIVE: Preg, Serum: POSITIVE — AB

## 2024-05-16 NOTE — ED Triage Notes (Signed)
 Pt c/o chest pain with back pain that started two hours ago. Pt states she has had dizziness that started on and off this past week.  Pt denies hx HTN, DM2, or CVA  Pt pain is mid sternum that is stabbing in nature.

## 2024-05-16 NOTE — ED Triage Notes (Signed)
 Chest  pain back pain  since yesterday headache  fpr several weeks  lmp  June 16

## 2024-05-17 ENCOUNTER — Emergency Department (HOSPITAL_COMMUNITY)

## 2024-05-17 LAB — TROPONIN I (HIGH SENSITIVITY): Troponin I (High Sensitivity): 2 ng/L (ref <18)

## 2024-05-17 LAB — D-DIMER, QUANTITATIVE: D-Dimer, Quant: 2.44 ug{FEU}/mL — ABNORMAL HIGH (ref 0.00–0.50)

## 2024-05-17 MED ORDER — IOHEXOL 350 MG/ML SOLN
75.0000 mL | Freq: Once | INTRAVENOUS | Status: AC | PRN
  Administered 2024-05-17: 75 mL via INTRAVENOUS

## 2024-05-17 MED ORDER — DIPHENHYDRAMINE HCL 50 MG/ML IJ SOLN
50.0000 mg | Freq: Once | INTRAMUSCULAR | Status: AC
  Administered 2024-05-17: 50 mg via INTRAVENOUS
  Filled 2024-05-17: qty 1

## 2024-05-17 NOTE — Discharge Instructions (Signed)
 You were evaluated in the Emergency Department and after careful evaluation, we did not find any emergent condition requiring admission or further testing in the hospital.  Your exam/testing today is overall reassuring.  Tests did not show signs of blood clots.  Recommend follow-up with dermatology to evaluate your skin issue.  We did find that you are not pregnant, recommend follow-up with OB/GYN.  Can continue Benadryl  as needed for itchiness.  Please return to the Emergency Department if you experience any worsening of your condition.   Thank you for allowing us  to be a part of your care.

## 2024-05-17 NOTE — ED Provider Notes (Signed)
 [facility]-EMER[facility]ENCY DEPT [facility] Emergency Department Provider Note MRN:  [id number]  Arrival date & time: 05/17/24     Chief Complaint   Chest Pain, Dizziness, and Back Pain   History of Present Illness   Christy [name] is a 38 y.o. year-old female with a history of sickle cell trait presenting to the ED with chief complaint of chest pain.  Chest pain and back pain today, center of the chest and back.  Has had some dizziness described as lightheadedness for the past week or 2.  Also continues to deal with hives and occasional facial swelling of unclear cause.  Review of Systems  A thorough review of systems was obtained and all systems are negative except as noted in the HPI and PMH.   Patient's Health History    Past Medical History:  Diagnosis Date   Anemia    Counseling for birth control, oral contraceptives 11/29/2023   Diarrhea during pregnancy 07/16/2018   Nausea and vomiting of pregnancy, antepartum 03/04/2018   Preterm labor    Sickle cell trait [facility])    Supervision of other normal pregnancy, antepartum 02/25/2018    Nursing Staff Provider Office Location  [facility]  Dating  U/S Language  English Anatomy US   02-27-18 Flu Vaccine   Genetic Screen  NIPS:   AFP:   First Screen:  Quad:   TDaP vaccine   05-27-18 Hgb A1C or  GTT Early  Third trimester  Rhogam     LAB RESULTS  Feeding Plan Breast  Blood Type --/--/O POS, O POS Performed at [facility], [address] [place], [place] [address]  [phone]/29 1959)  Contra   Vaginal Pap smear, abnormal     Past Surgical History:  Procedure Laterality Date   CESAREAN SECTION     DILATION AND CURETTAGE OF UTERUS N/A 05/28/2022   Procedure: DILATATION AND CURETTAGE WITH INTRAOPERATIVE ULTRASOUND;  Surgeon: Ozan, Jennifer, DO;  Location: [facility] OR;  Service: Gynecology;  Laterality: N/A;   FIBULA FRACTURE SURGERY     TIBIA FRACTURE SURGERY     VULVA SURGERY     Hematoma   WISDOM TOOTH EXTRACTION      Family History  Problem  Relation Age of Onset   Diabetes Mother    Diabetes Brother    Diabetes Maternal Aunt    Kidney disease Maternal Aunt    Diabetes Paternal Grandmother    Dementia Paternal Grandmother     Social History   Socioeconomic History   Marital status: Married    Spouse name: Not on file   Number of children: Not on file   Years of education: Not on file   Highest education level: Not on file  Occupational History   Not on file  Tobacco Use   Smoking status: Never   Smokeless tobacco: Never  Vaping Use   Vaping status: Never Used  Substance and Sexual Activity   Alcohol use: Never   Drug use: Never   Sexual activity: Not Currently    Birth control/protection: None  Other Topics Concern   Not on file  Social History Narrative   Not on file   Social Drivers of Health   Financial Resource Strain: Not on file  Food Insecurity: No Food Insecurity (04/10/2024)   Hunger Vital Sign    Worried About Running Out of Food in the Last Year: Never true    Ran Out of Food in the Last Year: Never true  Transportation Needs: No Transportation Needs (04/10/2024)  PRAPARE - Administrator, Civil Service (Medical): No    Lack of Transportation (Non-Medical): No  Physical Activity: Not on file  Stress: Not on file  Social Connections: Moderately Integrated (04/10/2024)   Social Connection and Isolation Panel    Frequency of Communication with Friends and Family: More than three times a week    Frequency of Social Gatherings with Friends and Family: More than three times a week    Attends Religious Services: More than 4 times per year    Active Member of [employer] or Organizations: Not on file    Attends Banker Meetings: Never    Marital Status: Living with partner  Intimate Partner Violence: Not At Risk (04/10/2024)   Humiliation, Afraid, Rape, and Kick questionnaire    Fear of Current or Ex-Partner: No    Emotionally Abused: No    Physically Abused: No    Sexually Abused:  No     Physical Exam   Vitals:   05/17/24 0430 05/17/24 0500  BP: (!) 102/58 (!) 103/59  Pulse: 80 96  Resp: 17 17  Temp:    SpO2: 100% 99%    CONSTITUTIONAL: Well-appearing, NAD NEURO/PSYCH:  Alert and oriented x 3, no focal deficits EYES:  eyes equal and reactive ENT/NECK:  no LAD, no JVD CARDIO: Regular rate, well-perfused, normal S1 and S2 PULM:  CTAB no wheezing or rhonchi GI/GU:  non-distended, non-tender MSK/SPINE:  No gross deformities, no edema SKIN:  no rash, atraumatic   *Additional and/or pertinent findings included in MDM below  Diagnostic and Interventional Summary    EKG Interpretation Date/Time:  Friday May 16 2024 21:52:00 EDT Ventricular Rate:  78 PR Interval:  124 QRS Duration:  70 QT Interval:  366 QTC Calculation: 417 R Axis:   47  Text Interpretation: Normal sinus rhythm Normal ECG No previous ECGs available Confirmed by Theadore Sharper [phone]) on 05/17/2024 3:14:45 AM       Labs Reviewed  COMPREHENSIVE METABOLIC PANEL WITH GFR - Abnormal; Notable for the following components:      Result Value   Glucose, Bld 126 (*)    Albumin 3.1 (*)    All other components within normal limits  CBC - Abnormal; Notable for the following components:   Hemoglobin 11.2 (*)    HCT 33.4 (*)    All other components within normal limits  HCG, SERUM, QUALITATIVE - Abnormal; Notable for the following components:   Preg, Serum POSITIVE (*)    All other components within normal limits  D-DIMER, QUANTITATIVE - Abnormal; Notable for the following components:   D-Dimer, Quant 2.44 (*)    All other components within normal limits  LIPASE, BLOOD  URINALYSIS, ROUTINE W REFLEX MICROSCOPIC  TROPONIN I (HIGH SENSITIVITY)  TROPONIN I (HIGH SENSITIVITY)    CT Angio Chest Pulmonary Embolism (PE) W or WO Contrast  Final Result      Medications  diphenhydrAMINE  (BENADRYL ) injection 50 mg (50 mg Intravenous Given 05/17/24 0419)  iohexol  (OMNIPAQUE ) 350 MG/ML injection  75 mL (75 mLs Intravenous Contrast Given 05/17/24 0539)     Procedures  /  Critical Care Procedures  ED Course and Medical Decision Making  Initial Impression and Ddx Suspect MSK or GERD however in triage patient's labs show positive hCG which raises concern for possible PE especially given the dizziness with chest pain and shortness of breath.  Still felt to be unlikely or not the most likely diagnosis.  Per years criteria, will obtain D-dimer.  No objective swelling to the face on my exam though she feels like it is swollen, providing some Benadryl .  Past medical/surgical history that increases complexity of ED encounter: None  Interpretation of Diagnostics I personally reviewed the EKG and my interpretation is as follows: Sinus rhythm  No significant blood count or electrolyte disturbance.  D-dimer positive  Patient Reassessment and Ultimate Disposition/Management     Discussed pros and cons of CT imaging in the setting of pregnancy with the patient and she consents to CT scan at this time.  CT PE study is negative, patient continues to look well with normal vitals, appropriate for discharge.  Patient management required discussion with the following services or consulting groups:  None  Complexity of Problems Addressed Acute illness or injury that poses threat of life of bodily function  Additional Data Reviewed and Analyzed Further history obtained from: Prior labs/imaging results  Additional Factors Impacting ED Encounter Risk Consideration of hospitalization  Ozell HERO. Theadore, MD [facility] Emergency Medicine [facility] [email or web address]  Final Clinical Impressions(s) / ED Diagnoses     ICD-10-CM   1. Hives  L50.9     2. Chest pain, unspecified type  R07.9       ED Discharge Orders     None        Discharge Instructions Discussed with and Provided to Patient:     Discharge Instructions      You were evaluated in the Emergency  Department and after careful evaluation, we did not find any emergent condition requiring admission or further testing in the hospital.  Your exam/testing today is overall reassuring.  Tests did not show signs of blood clots.  Recommend follow-up with dermatology to evaluate your skin issue.  We did find that you are not pregnant, recommend follow-up with OB/GYN.  Can continue Benadryl  as needed for itchiness.  Please return to the Emergency Department if you experience any worsening of your condition.   Thank you for allowing us  to be a part of your care.       Theadore Ozell HERO, MD 05/17/24 [phone]

## 2024-05-17 NOTE — ED Notes (Signed)
 This RN reviewed discharge instructions with patient. She verbalized understanding and denied any further questions. PT well appearing upon discharge and reports no pain. Pt ambulated with stable gait to exit. Pt endorses ride home.

## 2024-05-29 ENCOUNTER — Other Ambulatory Visit: Payer: Self-pay

## 2024-05-29 ENCOUNTER — Emergency Department (HOSPITAL_BASED_OUTPATIENT_CLINIC_OR_DEPARTMENT_OTHER): Admission: EM | Admit: 2024-05-29 | Discharge: 2024-05-30 | Disposition: A | Discharge status: Home / Self Care

## 2024-05-29 ENCOUNTER — Encounter (HOSPITAL_BASED_OUTPATIENT_CLINIC_OR_DEPARTMENT_OTHER): Payer: Self-pay

## 2024-05-29 DIAGNOSIS — L500 Allergic urticaria: Secondary | ICD-10-CM | POA: Insufficient documentation

## 2024-05-29 DIAGNOSIS — R000 Tachycardia, unspecified: Secondary | ICD-10-CM | POA: Insufficient documentation

## 2024-05-29 DIAGNOSIS — T7840XA Allergy, unspecified, initial encounter: Secondary | ICD-10-CM

## 2024-05-29 MED ORDER — FAMOTIDINE 20 MG PO TABS: 40.0000 mg | ORAL_TABLET | Freq: Every day | ORAL | 0 refills | Status: AC

## 2024-05-29 MED ORDER — FAMOTIDINE IN NACL 20-0.9 MG/50ML-% IV SOLN
20.0000 mg | Freq: Once | INTRAVENOUS | Status: AC
  Administered 2024-05-29: 20 mg via INTRAVENOUS
  Filled 2024-05-29: qty 50

## 2024-05-29 MED ORDER — DIPHENHYDRAMINE HCL 50 MG/ML IJ SOLN
25.0000 mg | Freq: Once | INTRAMUSCULAR | Status: AC
  Administered 2024-05-29: 25 mg via INTRAVENOUS
  Filled 2024-05-29: qty 1

## 2024-05-29 MED ORDER — METHYLPREDNISOLONE SODIUM SUCC 125 MG IJ SOLR
125.0000 mg | Freq: Once | INTRAMUSCULAR | Status: AC
  Administered 2024-05-29: 125 mg via INTRAVENOUS
  Filled 2024-05-29: qty 2

## 2024-05-29 MED ORDER — PREDNISONE 10 MG PO TABS: 40.0000 mg | ORAL_TABLET | Freq: Every day | ORAL | 0 refills | Status: AC

## 2024-05-29 NOTE — ED Triage Notes (Signed)
 Pt reports possibly having allergic reaction to soy in [place] food she was eating earlier around 6PM. Pt reports hives and facial swelling. Pt took home Epi-Pen at 2040 hours.

## 2024-05-29 NOTE — ED Provider Notes (Signed)
 [place] EMERGENCY DEPARTMENT AT [facility] Provider Note   CSN: [id number] Arrival date & time: 05/29/24  2139     Patient presents with: Allergic Reaction (/)   Christy [name] is a 38 y.o. female.   38 year old female presents for evaluation of allergic reaction.  She states she thinks it was likely from soy in her [place] food.  Patient states she had allergic reaction recently and was given an EpiPen .  She started with Benadryl  and then developed both facial swelling and hives and gave her self a dose of her EpiPen  as well.  She states she is feeling well at this time.  She states overall her symptoms seem improved.  She denies any shortness of breath, wheezing, cough, sensation like her throat is closing or nausea or vomiting.   Allergic Reaction Presenting symptoms: no rash        Prior to Admission medications   Medication Sig Start Date End Date Taking? Authorizing Provider  famotidine  (PEPCID ) 20 MG tablet Take 2 tablets (40 mg total) by mouth daily for 5 days. 05/29/24 06/03/24 Yes Graylyn Bunney L, DO  predniSONE  (DELTASONE ) 10 MG tablet Take 4 tablets (40 mg total) by mouth daily for 4 days. 05/29/24 06/02/24 Yes Othell Jaime L, DO  acetaminophen  (TYLENOL ) 500 MG tablet Take 500 mg by mouth every 6 (six) hours as needed. 03/25/23   [provider]  albuterol  (VENTOLIN  HFA) 108 (90 Base) MCG/ACT inhaler Inhale 1-2 puffs into the lungs every 6 (six) hours as needed for wheezing or shortness of breath. 10/03/22   Hazen Darryle BRAVO, FNP  cyclobenzaprine  (FLEXERIL ) 10 MG tablet Take 10 mg by mouth as needed for muscle spasms. 03/25/23   [provider]  diphenhydrAMINE  (BENADRYL ) 25 MG tablet Take 1 tablet (25 mg total) by mouth every 6 (six) hours as needed for itching. 04/25/24   Cardama, Raynell Moder, MD  EPINEPHrine  0.3 mg/0.3 mL IJ SOAJ injection Inject 0.3 mg into the muscle as needed for anaphylaxis. 05/15/24   Jarold Olam HERO, PA-C   ibuprofen  (ADVIL ) 400 MG tablet Take 400 mg by mouth as needed for moderate pain (pain score 4-6). 03/25/23   [provider]  loratadine  (CLARITIN ) 10 MG tablet Take 1 tablet (10 mg total) by mouth daily for 5 days. 04/11/24 04/16/24  Caleen Burgess BROCKS, MD  methylPREDNISolone  (MEDROL  DOSEPAK) 4 MG TBPK tablet Take as prescribed 04/26/24   Kommor, Madison, MD  furosemide  (LASIX ) 20 MG tablet Take 1 tablet (20 mg total) by mouth daily. Patient not taking: Reported on 09/05/2018 07/30/18 03/20/21  Raford Lenis, MD    Allergies: Soybean oil, Amoxicillin, Penicillin v potassium, and Penicillins    Review of Systems  Constitutional:  Negative for chills and fever.  HENT:  Negative for ear pain and sore throat.   Eyes:  Negative for pain and visual disturbance.  Respiratory:  Negative for cough and shortness of breath.   Cardiovascular:  Negative for chest pain and palpitations.  Gastrointestinal:  Negative for abdominal pain and vomiting.  Genitourinary:  Negative for dysuria and hematuria.  Musculoskeletal:  Negative for arthralgias and back pain.  Skin:  Negative for color change and rash.  Neurological:  Negative for seizures and syncope.  All other systems reviewed and are negative.   Updated Vital Signs BP 120/67   Pulse 95   Temp 98.4 F (36.9 C) (Oral)   Resp (!) 22   Ht 5' 5 (1.651 m)   Wt 72.6 kg  LMP 04/21/2024   SpO2 100%   BMI 26.63 kg/m   Physical Exam Vitals and nursing note reviewed.  Constitutional:      General: She is not in acute distress.    Appearance: She is well-developed.  HENT:     Head: Normocephalic and atraumatic.     Mouth/Throat:     Mouth: Mucous membranes are moist.     Pharynx: Oropharynx is clear. No oropharyngeal exudate.  Eyes:     Conjunctiva/sclera: Conjunctivae normal.  Cardiovascular:     Rate and Rhythm: Regular rhythm. Tachycardia present.     Heart sounds: No murmur heard. Pulmonary:     Effort: Pulmonary effort is normal. No  respiratory distress.     Breath sounds: Normal breath sounds.  Abdominal:     Palpations: Abdomen is soft.     Tenderness: There is no abdominal tenderness.  Musculoskeletal:        General: No swelling.     Cervical back: Neck supple.  Skin:    General: Skin is warm and dry.     Capillary Refill: Capillary refill takes less than 2 seconds.     Comments: Patient with very mild face swelling and mild hives on face  Neurological:     Mental Status: She is alert.  Psychiatric:        Mood and Affect: Mood normal.     (all labs ordered are listed, but only abnormal results are displayed) Labs Reviewed - No data to display  EKG: EKG Interpretation Date/Time:  Thursday May 29 2024 21:48:45 EDT Ventricular Rate:  105 PR Interval:  138 QRS Duration:  66 QT Interval:  359 QTC Calculation: 475 R Axis:   62  Text Interpretation: Sinus tachycardia Borderline T abnormalities, anterior leads No STEMI no acute changes when compared to prior EKG from 05/16/2024 Confirmed by Gennaro Bouchard ([id number]) on 05/29/2024 9:55:51 PM  Radiology: No results found.   Procedures   Medications Ordered in the ED  famotidine  (PEPCID ) IVPB 20 mg premix (20 mg Intravenous New Bag/Given 05/29/24 2246)  methylPREDNISolone  sodium succinate (SOLU-MEDROL ) 125 mg/2 mL injection 125 mg (125 mg Intravenous Given 05/29/24 2244)                                    Medical Decision Making Cardiac monitor interpretation: sinus tachycardia, no ectopy   Patient here for allergic reaction possibly to soy.  She did not receive EpiPen .  Is tachycardic on arrival with slight face swelling but otherwise appears very well.  No nausea or vomiting.  We will monitor her for a few more hours.  I gave her steroids and Pepcid  here.  Will prescribe her these as well. Patient signed out to night time provider pending ultimate disposition.   Problems Addressed: Allergic reaction, initial encounter: acute illness or  injury  Amount and/or Complexity of Data Reviewed External Data Reviewed: notes.    Details: Prior ED records reviewed and patient was recently here for similar symptoms and prescribed pepcid , steroids, and an epi pen  ECG/medicine tests: ordered and independent interpretation performed. Decision-making details documented in ED Course.    Details: Ordered and interpreted by me in the absence of cardiology and shows sinus tachycardia, no STEMI or acute abnormality or change when compared to prior EKG.   Risk OTC drugs. Prescription drug management. Drug therapy requiring intensive monitoring for toxicity.     Final diagnoses:  Allergic  reaction, initial encounter    ED Discharge Orders          Ordered    predniSONE  (DELTASONE ) 10 MG tablet  Daily        05/29/24 2245    famotidine  (PEPCID ) 20 MG tablet  Daily        05/29/24 2245               [place] Prevo, Duwaine L, DO 05/29/24 2248

## 2024-05-29 NOTE — Discharge Instructions (Addendum)
 Take your steroids as prescribed.  Take your Pepcid  as prescribed and you can use Benadryl  as needed for itching.  Use your EpiPen  as needed for allergic reactions.  Follow-up with primary care and allergist.  Return to the ER for new or worsening symptoms.

## 2024-05-29 NOTE — ED Provider Notes (Signed)
 10:51 PM Assumed care from Dr. Gennaro, please see their note for full history, physical and decision making until this point. In brief this is a 38 y.o. year old female who presented to the ED tonight with Allergic Reaction (/)     H/o allergies. [place] food tonight. Felt bad.  Epi around 2130, needs reeval around 0130 for likely d/c.   Discharge instructions, including strict return precautions for new or worsening symptoms, given. Patient and/or family verbalized understanding and agreement with the plan as described.   Labs, studies and imaging reviewed by myself and considered in medical decision making if ordered. Imaging interpreted by radiology.  Labs Reviewed - No data to display  No orders to display    No follow-ups on file.

## 2024-05-30 MED ORDER — EPINEPHRINE 0.3 MG/0.3ML IJ SOAJ: 0.3000 mg | INTRAMUSCULAR | 0 refills | Status: DC | PRN

## 2024-06-05 ENCOUNTER — Other Ambulatory Visit: Payer: Self-pay

## 2024-06-07 ENCOUNTER — Encounter (HOSPITAL_BASED_OUTPATIENT_CLINIC_OR_DEPARTMENT_OTHER): Payer: Self-pay

## 2024-06-07 ENCOUNTER — Other Ambulatory Visit: Payer: Self-pay

## 2024-06-07 ENCOUNTER — Emergency Department (HOSPITAL_BASED_OUTPATIENT_CLINIC_OR_DEPARTMENT_OTHER)
Admission: EM | Admit: 2024-06-07 | Discharge: 2024-06-07 | Disposition: A | Discharge status: Home / Self Care | Attending: Emergency Medicine | Admitting: Emergency Medicine

## 2024-06-07 DIAGNOSIS — T7840XA Allergy, unspecified, initial encounter: Secondary | ICD-10-CM | POA: Insufficient documentation

## 2024-06-07 LAB — PREGNANCY, URINE: Preg Test, Ur: POSITIVE — AB

## 2024-06-07 LAB — HCG, QUANTITATIVE, PREGNANCY: hCG, Beta Chain, Quant, S: 7055 m[IU]/mL — ABNORMAL HIGH (ref <5)

## 2024-06-07 MED ORDER — EPINEPHRINE 0.3 MG/0.3ML IJ SOAJ: 0.3000 mg | Freq: Once | INTRAMUSCULAR | Status: DC

## 2024-06-07 MED ORDER — DIPHENHYDRAMINE HCL 50 MG/ML IJ SOLN
25.0000 mg | Freq: Once | INTRAMUSCULAR | Status: AC
  Administered 2024-06-07: 25 mg via INTRAVENOUS
  Filled 2024-06-07: qty 1

## 2024-06-07 MED ORDER — METHYLPREDNISOLONE SODIUM SUCC 125 MG IJ SOLR
125.0000 mg | Freq: Once | INTRAMUSCULAR | Status: AC
  Administered 2024-06-07: 125 mg via INTRAVENOUS
  Filled 2024-06-07: qty 2

## 2024-06-07 NOTE — ED Provider Notes (Signed)
 [place] EMERGENCY DEPARTMENT AT [facility] Provider Note   CSN: [id number] Arrival date & time: 06/07/24  1744     Patient presents with: Allergic Reaction   Christy [name] is a 38 y.o. female history of sickle cell trait presents with concern for allergic reaction.  She states an hour ago she started noticing facial and lip swelling.  States she developed a cough and threw up once at that time.  She has not had any recurrent episodes of emesis.  No difficulty breathing, states she does feel like she is having little bit difficulty swallowing.  Appears that she has been here numerous times over the past couple months with similar presentations.  She is often required epinephrine .  Today she is not complaining of any urticaria.     Allergic Reaction     Past Medical History:  Diagnosis Date   Anemia    Counseling for birth control, oral contraceptives 11/29/2023   Diarrhea during pregnancy 07/16/2018   Nausea and vomiting of pregnancy, antepartum 03/04/2018   Preterm labor    Sickle cell trait [facility])    Supervision of other normal pregnancy, antepartum 02/25/2018    Nursing Staff Provider Office Location  [facility]  Dating  U/S Language  English Anatomy US   02-27-18 Flu Vaccine   Genetic Screen  NIPS:   AFP:   First Screen:  Quad:   TDaP vaccine   05-27-18 Hgb A1C or  GTT Early  Third trimester  Rhogam     LAB RESULTS  Feeding Plan Breast  Blood Type --/--/O POS, O POS Performed at [facility], [address], [place], [place] [address]  (04/29 1957)  Contra   Vaginal Pap smear, abnormal      Prior to Admission medications   Medication Sig Start Date End Date Taking? Authorizing Provider  acetaminophen  (TYLENOL ) 500 MG tablet Take 500 mg by mouth every 6 (six) hours as needed. 03/25/23   [provider]  albuterol  (VENTOLIN  HFA) 108 (90 Base) MCG/ACT inhaler Inhale 1-2 puffs into the lungs every 6 (six) hours as needed for wheezing or shortness of breath.  10/03/22   Hazen Darryle BRAVO, FNP  cyclobenzaprine  (FLEXERIL ) 10 MG tablet Take 10 mg by mouth as needed for muscle spasms. 03/25/23   [provider]  diphenhydrAMINE  (BENADRYL ) 25 MG tablet Take 1 tablet (25 mg total) by mouth every 6 (six) hours as needed for itching. 04/25/24   Cardama, Raynell Moder, MD  EPINEPHrine  0.3 mg/0.3 mL IJ SOAJ injection Inject 0.3 mg into the muscle as needed for anaphylaxis. 05/30/24   Mesner, Selinda, MD  famotidine  (PEPCID ) 20 MG tablet Take 2 tablets (40 mg total) by mouth daily for 5 days. 05/29/24 06/03/24  Kammerer, Duwaine L, DO  ibuprofen  (ADVIL ) 400 MG tablet Take 400 mg by mouth as needed for moderate pain (pain score 4-6). 03/25/23   [provider]  loratadine  (CLARITIN ) 10 MG tablet Take 1 tablet (10 mg total) by mouth daily for 5 days. 04/11/24 04/16/24  Caleen Burgess BROCKS, MD  methylPREDNISolone  (MEDROL  DOSEPAK) 4 MG TBPK tablet Take as prescribed 04/26/24   Kommor, Madison, MD  furosemide  (LASIX ) 20 MG tablet Take 1 tablet (20 mg total) by mouth daily. Patient not taking: Reported on 09/05/2018 07/30/18 03/20/21  Raford Lenis, MD    Allergies: Soybean oil, Amoxicillin, Penicillin v potassium, and Penicillins    Review of Systems  Respiratory:  Negative for shortness of breath.     Updated Vital Signs BP  127/66   Pulse 79   Temp 98.8 F (37.1 C)   Resp 18   LMP 05/21/2024 (Approximate)   SpO2 99%   Physical Exam Vitals and nursing note reviewed.  Constitutional:      General: She is not in acute distress.    Appearance: She is well-developed.  HENT:     Head: Normocephalic and atraumatic.     Comments: Very minimal right sided upper lip swelling, airways patent, no stridor, no urticaria Eyes:     Conjunctiva/sclera: Conjunctivae normal.  Cardiovascular:     Rate and Rhythm: Normal rate and regular rhythm.     Heart sounds: No murmur heard. Pulmonary:     Effort: Pulmonary effort is normal. No respiratory distress.     Breath sounds:  Normal breath sounds.  Abdominal:     Palpations: Abdomen is soft.     Tenderness: There is no abdominal tenderness.  Musculoskeletal:        General: No swelling.     Cervical back: Neck supple.  Skin:    General: Skin is warm and dry.     Capillary Refill: Capillary refill takes less than 2 seconds.  Neurological:     Mental Status: She is alert.  Psychiatric:        Mood and Affect: Mood normal.     (all labs ordered are listed, but only abnormal results are displayed) Labs Reviewed  PREGNANCY, URINE - Abnormal; Notable for the following components:      Result Value   Preg Test, Ur POSITIVE (*)    All other components within normal limits  HCG, QUANTITATIVE, PREGNANCY - Abnormal; Notable for the following components:   hCG, Beta Chain, Quant, S 7,055 (*)    All other components within normal limits    EKG: None  Radiology: No results found.   Procedures   Medications Ordered in the ED  EPINEPHrine  (EPI-PEN) injection 0.3 mg (has no administration in time range)  diphenhydrAMINE  (BENADRYL ) injection 25 mg (has no administration in time range)  diphenhydrAMINE  (BENADRYL ) injection 25 mg (25 mg Intravenous Given 06/07/24 1814)  methylPREDNISolone  sodium succinate (SOLU-MEDROL ) 125 mg/2 mL injection 125 mg (125 mg Intravenous Given 06/07/24 1814)                                    Medical Decision Making Amount and/or Complexity of Data Reviewed Labs: ordered.  Risk Prescription drug management.   This patient presents to the ED with chief complaint(s) of allergic reaction.  The complaint involves an extensive differential diagnosis and also carries with it a high risk of complications and morbidity.   Pertinent past medical history as listed in HPI  The differential diagnosis includes  Anaphylaxis, allergic reaction Additional history obtained: Records reviewed Care Everywhere/External Records  Assessment and management:   Hemodynamically stable,  nontoxic-appearing patient presents with concern for allergic reaction.  She states she started noticing her lip was swollen on the right side by an hour ago.  Reports that shortly after she developed a cough and threw up once.  She denies any rashes.  States that she was having difficulty swallowing but not breathing.  On exam she has no stridor.  She has no urticaria.  There is minimal lip swelling noted on the right.  Abdomen is nontender.  Appears that she has been to the emergency room numerous times over the past few months with similar presentations.  She is often required epinephrine .  However today she appears to be stable.  Airway is patent.  Will start off with Benadryl  and Solu-Medrol  and reevaluate.  Reevaluation patient reports improvement of her symptoms.  She continues to be without urticaria, no significant angioedema appreciated, no stridor.  She states she is ready go home.  Will give her final dose of Benadryl  before discharge.  She has a EpiPen  at home.  Will additionally provide contact information for asthma and allergy specialist in [place].  Patient made aware that she is positive for pregnancy test and encouraged to follow-up with a repeat.  Independent ECG interpretation:  none  Independent labs interpretation:  The following labs were independently interpreted:  Urine pregnancy test positive, hCG quant 7000  Independent visualization and interpretation of imaging: I independently visualized the following imaging with scope of interpretation limited to determining acute life threatening conditions related to emergency care: none    Consultations obtained:   none  Disposition:   Patient will be discharged home. The patient has been appropriately medically screened and/or stabilized in the ED. I have low suspicion for any other emergent medical condition which would require further screening, evaluation or treatment in the ED or require inpatient management. At time of  discharge the patient is hemodynamically stable and in no acute distress. I have discussed work-up results and diagnosis with patient and answered all questions. Patient is agreeable with discharge plan. We discussed strict return precautions for returning to the emergency department and they verbalized understanding.     Social Determinants of Health:   none  This note was dictated with voice recognition software.  Despite best efforts at proofreading, errors may have occurred which can change the documentation meaning.       Final diagnoses:  Allergic reaction, initial encounter    ED Discharge Orders     None          Donnajean Lynwood VEAR DEVONNA 06/07/24 2242    Armenta Canning, MD 06/08/24 [phone]

## 2024-06-07 NOTE — ED Triage Notes (Signed)
 She reports swelling of her lips earlier today that went away when I took Benadryl . She is here r/t recurrence of lip swelling. She c/o very mild globus sensation in back of throat. She is breathing normally with no stridor. She has no rash/urticaria. She tells us  that she vomited once today.

## 2024-06-07 NOTE — Discharge Instructions (Addendum)
 Please call the number on the sheet below to follow-up with the allergist.   [facility] at [facility] [address] [place],  [place]  [address]  Get Driving Directions Main: [phone]

## 2024-06-07 NOTE — ED Notes (Signed)
 Check on patient, she was sleeping upon entering the room. All vitals are stable. She reports her swelling feels the same. Will inform the provider. Patient currently ambulated to the bathroom and to provide a urine sample.

## 2024-06-07 NOTE — ED Triage Notes (Signed)
 Pt to ED reporting lip swelling this morning at 8am. Benadryl  at home decreased the swelling but approximately 1 hour ago pt noticed swelling to her lip again, vomited and is now experiencing throat tingling and cough. Pt reports having received an EpiPen  in the past but is unsure what the allergen is.

## 2024-06-07 NOTE — ED Notes (Signed)
 The edema of lips has not increased since IV meds given. She continues to breath normally and no c/o itching/urticaria.

## 2024-06-21 ENCOUNTER — Inpatient Hospital Stay (HOSPITAL_COMMUNITY)

## 2024-06-21 ENCOUNTER — Inpatient Hospital Stay (HOSPITAL_COMMUNITY)
Admission: AD | Admit: 2024-06-21 | Discharge: 2024-06-21 | Disposition: A | Discharge status: Home / Self Care | Attending: Obstetrics and Gynecology | Admitting: Obstetrics and Gynecology

## 2024-06-21 ENCOUNTER — Encounter (HOSPITAL_COMMUNITY): Payer: Self-pay | Admitting: *Deleted

## 2024-06-21 DIAGNOSIS — O209 Hemorrhage in early pregnancy, unspecified: Secondary | ICD-10-CM

## 2024-06-21 DIAGNOSIS — Z3A01 Less than 8 weeks gestation of pregnancy: Secondary | ICD-10-CM | POA: Diagnosis not present

## 2024-06-21 DIAGNOSIS — O021 Missed abortion: Secondary | ICD-10-CM | POA: Insufficient documentation

## 2024-06-21 LAB — CBC
HCT: 28.7 % — ABNORMAL LOW (ref 36.0–46.0)
Hemoglobin: 9.4 g/dL — ABNORMAL LOW (ref 12.0–15.0)
MCH: 26.6 pg (ref 26.0–34.0)
MCHC: 32.8 g/dL (ref 30.0–36.0)
MCV: 81.3 fL (ref 80.0–100.0)
Platelets: 314 K/uL (ref 150–400)
RBC: 3.53 MIL/uL — ABNORMAL LOW (ref 3.87–5.11)
RDW: 14.3 % (ref 11.5–15.5)
WBC: 7.6 K/uL (ref 4.0–10.5)
nRBC: 0 % (ref 0.0–0.2)

## 2024-06-21 LAB — HCG, QUANTITATIVE, PREGNANCY: hCG, Beta Chain, Quant, S: 493 m[IU]/mL — ABNORMAL HIGH (ref <5)

## 2024-06-21 LAB — ABO/RH: ABO/RH(D): O POS

## 2024-06-21 MED ORDER — MISOPROSTOL 200 MCG PO TABS: 800.0000 ug | ORAL_TABLET | Freq: Once | ORAL | 0 refills | Status: AC

## 2024-06-21 MED ORDER — OXYCODONE HCL 5 MG PO CAPS: 10.0000 mg | ORAL_CAPSULE | Freq: Four times a day (QID) | ORAL | 0 refills | Status: AC | PRN

## 2024-06-21 MED ORDER — ACETAMINOPHEN-CAFFEINE 500-65 MG PO TABS
2.0000 | ORAL_TABLET | Freq: Once | ORAL | Status: AC
  Administered 2024-06-21: 2 via ORAL
  Filled 2024-06-21: qty 2

## 2024-06-21 MED ORDER — ONDANSETRON HCL 4 MG PO TABS: 8.0000 mg | ORAL_TABLET | Freq: Three times a day (TID) | ORAL | 0 refills | Status: AC | PRN

## 2024-06-21 MED ORDER — FERRIC MALTOL 30 MG PO CAPS: 1.0000 | ORAL_CAPSULE | Freq: Two times a day (BID) | ORAL | 2 refills | Status: AC

## 2024-06-21 MED ORDER — ONDANSETRON 4 MG PO TBDP
8.0000 mg | ORAL_TABLET | Freq: Once | ORAL | Status: AC
  Administered 2024-06-21: 8 mg via ORAL
  Filled 2024-06-21: qty 2

## 2024-06-21 MED ORDER — OXYCODONE HCL 5 MG PO TABS
10.0000 mg | ORAL_TABLET | Freq: Once | ORAL | Status: AC
  Administered 2024-06-21: 10 mg via ORAL
  Filled 2024-06-21: qty 2

## 2024-06-21 NOTE — Discharge Instructions (Signed)
 Follow-up in the office in 1 week for lab work and miscarriage follow-up.

## 2024-06-21 NOTE — MAU Note (Signed)
 Pt says she went to [facility] ER on 06-07-2024 for an allergic reaction.- positive UPT. Red spotting started Tuesday - then brown - then tonight [place] after ballgame- started cramping and red VB. On arrival - a towel between her legs with red blood on towel.

## 2024-06-21 NOTE — MAU Provider Note (Signed)
 Chief Complaint:  Vaginal Bleeding   HPI   Event Date/Time   First Provider Initiated Contact with Patient 06/21/24 0225      Christy [name] is a 38 y.o. [id number] at [redacted]w[redacted]d who presents to maternity admissions reporting heavy vaginal bleeding, soaking pads within an hour.  On arrival patient had a towel between her legs with bright red blood.  She was previously seen in common ER on 06/07/2024 for positive UPT and her hCG level at that time was 7055.  No ultrasound imaging was performed at that visit on 06/07/2024.  Patient denies any fever, chills, but reports abdominal cramping that is moderate and is requesting pain medication at this time.   Pregnancy Course: un established   Past Medical History:  Diagnosis Date   Anemia    Counseling for birth control, oral contraceptives 11/29/2023   Diarrhea during pregnancy 07/16/2018   Nausea and vomiting of pregnancy, antepartum 03/04/2018   Preterm labor    Sickle cell trait [facility])    Supervision of other normal pregnancy, antepartum 02/25/2018    Nursing Staff Provider Office Location  [facility]  Dating  U/S Language  English Anatomy US   02-27-18 Flu Vaccine   Genetic Screen  NIPS:   AFP:   First Screen:  Quad:   TDaP vaccine   05-27-18 Hgb A1C or  GTT Early  Third trimester  Rhogam     LAB RESULTS  Feeding Plan Breast  Blood Type --/--/O POS, O POS Performed at [facility], [address] [place], [place] [address]  [phone])  Contra   Vaginal Pap smear, abnormal    OB History  Gravida Para Term Preterm AB Living  6 3 2 1 2 3   SAB IAB Ectopic Multiple Live Births  2   0 1    # Outcome Date GA Lbr Len/2nd Weight Sex Type Anes PTL Lv  6 Current           5 SAB 05/29/22 [redacted]w[redacted]d         4 Term 07/24/18 [redacted]w[redacted]d / 00:11 2384 g M VBAC EPI  LIV     Birth Comments: WNL  3 Preterm 11/26/15     CS-Unspec     2 Term 10/24/06     Vag-Spont     1 SAB            Past Surgical History:  Procedure Laterality Date   CESAREAN SECTION     DILATION  AND CURETTAGE OF UTERUS N/A 05/28/2022   Procedure: DILATATION AND CURETTAGE WITH INTRAOPERATIVE ULTRASOUND;  Surgeon: Ozan, Jennifer, DO;  Location: [facility] OR;  Service: Gynecology;  Laterality: N/A;   FIBULA FRACTURE SURGERY     TIBIA FRACTURE SURGERY     VULVA SURGERY     Hematoma   WISDOM TOOTH EXTRACTION     Family History  Problem Relation Age of Onset   Diabetes Mother    Diabetes Brother    Diabetes Maternal Aunt    Kidney disease Maternal Aunt    Diabetes Paternal Grandmother    Dementia Paternal Grandmother    Social History   Tobacco Use   Smoking status: Never   Smokeless tobacco: Never  Vaping Use   Vaping status: Never Used  Substance Use Topics   Alcohol use: Never   Drug use: Never   Allergies  Allergen Reactions   Soybean Oil Anaphylaxis    Other Reaction(s): Throat irritation, Throat swelling, Throat irritation, Throat swelling   Amoxicillin Hives  Other Reaction(s): Vomiting   Penicillin V Potassium     Other Reaction(s): Unknown   Penicillins Hives and Nausea And Vomiting    Has patient had a PCN reaction causing immediate rash, facial/tongue/throat swelling, SOB or lightheadedness with hypotension: Yes, no anaphylaxis reaction.  Has patient had a PCN reaction causing severe rash involving mucus membranes or skin necrosis: Yes Has patient had a PCN reaction that required hospitalization: No Has patient had a PCN reaction occurring within the last 10 years: Yes If all of the above answers are NO, then may proceed with Cephalosporin use.    Medications Prior to Admission  Medication Sig Dispense Refill Last Dose/Taking   acetaminophen  (TYLENOL ) 500 MG tablet Take 500 mg by mouth every 6 (six) hours as needed.      albuterol  (VENTOLIN  HFA) 108 (90 Base) MCG/ACT inhaler Inhale 1-2 puffs into the lungs every 6 (six) hours as needed for wheezing or shortness of breath. 1 each 0    cyclobenzaprine  (FLEXERIL ) 10 MG tablet Take 10 mg by mouth as needed for  muscle spasms.      diphenhydrAMINE  (BENADRYL ) 25 MG tablet Take 1 tablet (25 mg total) by mouth every 6 (six) hours as needed for itching.      EPINEPHrine  0.3 mg/0.3 mL IJ SOAJ injection Inject 0.3 mg into the muscle as needed for anaphylaxis. 1 each 0    famotidine  (PEPCID ) 20 MG tablet Take 2 tablets (40 mg total) by mouth daily for 5 days. 10 tablet 0    ibuprofen  (ADVIL ) 400 MG tablet Take 400 mg by mouth as needed for moderate pain (pain score 4-6).      loratadine  (CLARITIN ) 10 MG tablet Take 1 tablet (10 mg total) by mouth daily for 5 days.      methylPREDNISolone  (MEDROL  DOSEPAK) 4 MG TBPK tablet Take as prescribed 1 each 0     I have reviewed patient's Past Medical Hx, Surgical Hx, Family Hx, Social Hx, medications and allergies.   ROS  Pertinent items noted in HPI and remainder of comprehensive ROS otherwise negative.   PHYSICAL EXAM  Patient Vitals for the past 24 hrs:  BP Temp Temp src Pulse Resp Height Weight  06/21/24 0508 121/81 -- -- 65 -- -- --  06/21/24 0325 127/68 -- -- 67 -- -- --  06/21/24 0242 (!) 142/77 -- -- 70 -- -- --  06/21/24 0235 (!) 131/110 -- -- 76 -- -- --  06/21/24 0203 121/62 98.5 F (36.9 C) Oral 73 14 5' 5 (1.651 m) 72.6 kg    Constitutional: Well-developed, well-nourished female who appears very uncomfortable  Cardiovascular: normal rate & rhythm, warm and well-perfused Respiratory: normal effort, no problems with respiration noted GI: Abd soft, NT MS: Extremities nontender, no edema, normal ROM Neurologic: Alert and oriented x 4.   Pelvic:  On speculum exam (chaperoned by  [facility] RN)  A few large blood clots were removed manually with tract swabs approximately 25 to 50 cc of blood.  Cervix was visualized and the os appeared closed with no active red vaginal bleeding at this time.  Suspect miscarriage in progress     Labs: Results for orders placed or performed during the hospital encounter of 06/21/24 (from the past 24 hours)   ABO/Rh     Status: None   Collection Time: 06/21/24  2:57 AM  Result Value Ref Range   ABO/RH(D) O POS    No rh immune globuloin      NOT A RH  IMMUNE GLOBULIN CANDIDATE, PT RH POSITIVE Performed at [facility], [address], [place], [place] [address]   CBC     Status: Abnormal   Collection Time: 06/21/24  3:01 AM  Result Value Ref Range   WBC 7.6 4.0 - 10.5 K/uL   RBC 3.53 (L) 3.87 - 5.11 MIL/uL   Hemoglobin 9.4 (L) 12.0 - 15.0 g/dL   HCT 71.2 (L) 63.9 - 53.9 %   MCV 81.3 80.0 - 100.0 fL   MCH 26.6 26.0 - 34.0 pg   MCHC 32.8 30.0 - 36.0 g/dL   RDW 85.6 88.4 - 84.4 %   Platelets 314 150 - 400 K/uL   nRBC 0.0 0.0 - 0.2 %  hCG, quantitative, pregnancy     Status: Abnormal   Collection Time: 06/21/24  3:01 AM  Result Value Ref Range   hCG, Beta Chain, Quant, S 493 (H) <5 mIU/mL   HCG 06/07/24 : 7,055 HCG 06/21/24:  493    Imaging:  US  OB LESS THAN 14 WEEKS WITH OB TRANSVAGINAL Result Date: 06/21/2024 CLINICAL DATA:  Vaginal bleeding EXAM: OBSTETRIC <14 WK US  AND TRANSVAGINAL OB US  TECHNIQUE: Both transabdominal and transvaginal ultrasound examinations were performed for complete evaluation of the gestation as well as the maternal uterus, adnexal regions, and pelvic cul-de-sac. Transvaginal technique was performed to assess early pregnancy. COMPARISON:  None Available. FINDINGS: Intrauterine gestational sac: None Yolk sac:  Not Visualized. Embryo:  Not Visualized. Cardiac Activity: Not Visualized. Heart Rate:   bpm MSD:   mm    w     d CRL:    mm    w    d                  US  EDC: Subchorionic hemorrhage:  None visualized. Maternal uterus/adnexae: No adnexal mass or free fluid. Markedly heterogeneous, thickened appearing endometrium measuring up to 19 mm. IMPRESSION: No intrauterine pregnancy visualized. Differential considerations would include early intrauterine pregnancy too early to visualize, spontaneous abortion, or occult ectopic pregnancy. Recommend close clinical  followup and serial quantitative beta HCGs and ultrasounds. Markedly heterogeneous and thickened endometrium. This could reflect blood products or retained products of conception. Electronically Signed   By: Franky Crease M.D.   On: 06/21/2024 03:53    MDM & MAU COURSE  MDM:  HIGH  Heavy Vaginal bleeding in early pregnancy/ Miscarriage CBC: c/w anemia @ 9.4 ( RX sent for iron  supplementation) HCG Quant: decreased to 493 ABO: O Positive OB Ultrasound (consistent with a miscarriage.  The endometrial lining measures up to 19 mm's it is heterogenous and thickened and could reflect blood products (products of conception) Will discuss options with patient regarding management of the miscarriage.    Differential diagnosis considered for 1st trimester vaginal bleeding includes but is not limited to: ectopic pregnancy, complete spontaneous abortion, incomplete abortion, missed abortion, threatened abortion, embryonic/fetal demise, cervical insufficiency, cervical or vaginal disorder     Reassured patient that miscarriage is common with ~1/4 of women experiencing it in their lifetime. Reassured patient that there is nothing she did or did not do to cause this. Reviewed most common reason is presumed to be genetic abnormalities that allow a pregnancy to start but not continue past an early stage, but realistically we do not know the cause in most cases. Reviewed that studies show no definite difference between attempting another pregnancy sooner vs waiting, though some studies do show better live birth outcomes with trying sooner. Reviewed options of  expectant, medical, or surgical management. After counseling she elected for medical management. Rx sent for misoprostol  800 mcg buccal, zofran  ODT, and 4 tabs of 5mg  Oxycodone . Reviewed that cramping, bleeding are normal in the first few hours after taking the medication, but should eventually wane. Reviewed warning signs of heavy vaginal bleeding soaking  through >1 pad per hour, crescendo abdominal pain, and fever. . Blood type --/--/O POS (08/16 0257), rhogam was not indicated.  We discussed return precautions including crescendo abdominal pain, heavy vaginal bleeding soaking >1 pad/hour, and fever.   After discussion with patient and Dr. Abig[facility] Attending) will proceed with Cytotec  for medical management to complete spontaneous abortion.  Patient verbalized understanding after side effects risks and benefits were discussed.    MAU Course: Orders Placed This Encounter  Procedures   US  OB LESS THAN 14 WEEKS WITH OB TRANSVAGINAL   CBC   hCG, quantitative, pregnancy   ABO/Rh   Discharge patient Discharge disposition: 01-Home or Self Care; Discharge patient date: 06/21/2024   Discharge patient Discharge disposition: 01-Home or Self Care; Discharge patient date: 06/21/2024   Meds ordered this encounter  Medications   oxyCODONE  (Oxy IR/ROXICODONE ) immediate release tablet 10 mg    Refill:  0   ondansetron  (ZOFRAN -ODT) disintegrating tablet 8 mg   acetaminophen -caffeine  (EXCEDRIN TENSION HEADACHE) 500-65 MG per tablet 2 tablet   misoprostol  (CYTOTEC ) 200 MCG tablet    Sig: Take 4 tablets (800 mcg total) by mouth once for 1 dose. Place all 4 tabs on the side of your  cheek    Dispense:  4 tablet    Refill:  0    Supervising Provider:   PRATT, TANYA S [[id number]]   oxycodone  (OXY-IR) 5 MG capsule    Sig: Take 2 capsules (10 mg total) by mouth every 6 (six) hours as needed for up to 3 days for pain.    Dispense:  12 capsule    Refill:  0    Supervising Provider:   PRATT, TANYA S [[id number]]   ondansetron  (ZOFRAN ) 4 MG tablet    Sig: Take 2 tablets (8 mg total) by mouth every 8 (eight) hours as needed for up to 3 days for nausea or vomiting.    Dispense:  20 tablet    Refill:  0    Supervising Provider:   PRATT, TANYA S [[id number]]   Ferric Maltol  30 MG CAPS    Sig: Take 1 capsule (30 mg total) by mouth 2 (two) times daily. Please take one hour before  breakfast and dinner    Dispense:  60 capsule    Refill:  2    Supervising Provider:   PRATT, TANYA S [[id number]]     I have reviewed the patient chart and performed the physical exam . I have ordered & interpreted the lab results and reviewed and interpreted the ultrasound images and agree with the radiologist report  Medications ordered as stated below.  A/P as described below.  Counseling and education provided and patient agreeable  with plan as described below. Verbalized understanding.    ASSESSMENT   1. Missed abortion   2. Vaginal bleeding affecting early pregnancy     PLAN  Discharge home in stable condition with return precautions.   See AVS for full description of information given to the patient including both verbal and written. Patient verbalized understanding and agrees with the plan as described above.  Allergies as of 06/21/2024       Reactions   Soybean Oil  Anaphylaxis   Other Reaction(s): Throat irritation, Throat swelling, Throat irritation, Throat swelling   Amoxicillin Hives   Other Reaction(s): Vomiting   Penicillin V Potassium    Other Reaction(s): Unknown   Penicillins Hives, Nausea And Vomiting   Has patient had a PCN reaction causing immediate rash, facial/tongue/throat swelling, SOB or lightheadedness with hypotension: Yes, no anaphylaxis reaction.  Has patient had a PCN reaction causing severe rash involving mucus membranes or skin necrosis: Yes Has patient had a PCN reaction that required hospitalization: No Has patient had a PCN reaction occurring within the last 10 years: Yes If all of the above answers are NO, then may proceed with Cephalosporin use.        Medication List     STOP taking these medications    methylPREDNISolone  4 MG Tbpk tablet Commonly known as: MEDROL  DOSEPAK       TAKE these medications    acetaminophen  500 MG tablet Commonly known as: TYLENOL  Take 500 mg by mouth every 6 (six) hours as needed.   albuterol  108  (90 Base) MCG/ACT inhaler Commonly known as: VENTOLIN  HFA Inhale 1-2 puffs into the lungs every 6 (six) hours as needed for wheezing or shortness of breath.   cyclobenzaprine  10 MG tablet Commonly known as: FLEXERIL  Take 10 mg by mouth as needed for muscle spasms.   diphenhydrAMINE  25 MG tablet Commonly known as: BENADRYL  Take 1 tablet (25 mg total) by mouth every 6 (six) hours as needed for itching.   EPINEPHrine  0.3 mg/0.3 mL Soaj injection Commonly known as: EPI-PEN Inject 0.3 mg into the muscle as needed for anaphylaxis.   famotidine  20 MG tablet Commonly known as: PEPCID  Take 2 tablets (40 mg total) by mouth daily for 5 days.   Ferric Maltol  30 MG Caps Take 1 capsule (30 mg total) by mouth 2 (two) times daily. Please take one hour before breakfast and dinner   ibuprofen  400 MG tablet Commonly known as: ADVIL  Take 400 mg by mouth as needed for moderate pain (pain score 4-6).   loratadine  10 MG tablet Commonly known as: CLARITIN  Take 1 tablet (10 mg total) by mouth daily for 5 days.   misoprostol  200 MCG tablet Commonly known as: Cytotec  Take 4 tablets (800 mcg total) by mouth once for 1 dose. Place all 4 tabs on the side of your  cheek   ondansetron  4 MG tablet Commonly known as: Zofran  Take 2 tablets (8 mg total) by mouth every 8 (eight) hours as needed for up to 3 days for nausea or vomiting.   oxycodone  5 MG capsule Commonly known as: OXY-IR Take 2 capsules (10 mg total) by mouth every 6 (six) hours as needed for up to 3 days for pain.          Follow-up Information     [facility] Follow up in 1 week(s).   Specialty: Obstetrics and Gynecology Why: Repeat pregnancy hormone levels, Miscarriage follow-up Contact information: [address] [place]  [address] [phone]                Allergies as of 06/21/2024       Reactions   Soybean Oil Anaphylaxis   Other Reaction(s):  Throat irritation, Throat swelling, Throat irritation, Throat swelling   Amoxicillin Hives   Other Reaction(s): Vomiting   Penicillin V Potassium    Other Reaction(s): Unknown   Penicillins Hives, Nausea And Vomiting   Has patient had a PCN reaction  causing immediate rash, facial/tongue/throat swelling, SOB or lightheadedness with hypotension: Yes, no anaphylaxis reaction.  Has patient had a PCN reaction causing severe rash involving mucus membranes or skin necrosis: Yes Has patient had a PCN reaction that required hospitalization: No Has patient had a PCN reaction occurring within the last 10 years: Yes If all of the above answers are NO, then may proceed with Cephalosporin use.        Medication List     STOP taking these medications    methylPREDNISolone  4 MG Tbpk tablet Commonly known as: MEDROL  DOSEPAK       TAKE these medications    acetaminophen  500 MG tablet Commonly known as: TYLENOL  Take 500 mg by mouth every 6 (six) hours as needed.   albuterol  108 (90 Base) MCG/ACT inhaler Commonly known as: VENTOLIN  HFA Inhale 1-2 puffs into the lungs every 6 (six) hours as needed for wheezing or shortness of breath.   cyclobenzaprine  10 MG tablet Commonly known as: FLEXERIL  Take 10 mg by mouth as needed for muscle spasms.   diphenhydrAMINE  25 MG tablet Commonly known as: BENADRYL  Take 1 tablet (25 mg total) by mouth every 6 (six) hours as needed for itching.   EPINEPHrine  0.3 mg/0.3 mL Soaj injection Commonly known as: EPI-PEN Inject 0.3 mg into the muscle as needed for anaphylaxis.   famotidine  20 MG tablet Commonly known as: PEPCID  Take 2 tablets (40 mg total) by mouth daily for 5 days.   Ferric Maltol  30 MG Caps Take 1 capsule (30 mg total) by mouth 2 (two) times daily. Please take one hour before breakfast and dinner   ibuprofen  400 MG tablet Commonly known as: ADVIL  Take 400 mg by mouth as needed for moderate pain (pain score 4-6).   loratadine  10 MG  tablet Commonly known as: CLARITIN  Take 1 tablet (10 mg total) by mouth daily for 5 days.   misoprostol  200 MCG tablet Commonly known as: Cytotec  Take 4 tablets (800 mcg total) by mouth once for 1 dose. Place all 4 tabs on the side of your  cheek   ondansetron  4 MG tablet Commonly known as: Zofran  Take 2 tablets (8 mg total) by mouth every 8 (eight) hours as needed for up to 3 days for nausea or vomiting.   oxycodone  5 MG capsule Commonly known as: OXY-IR Take 2 capsules (10 mg total) by mouth every 6 (six) hours as needed for up to 3 days for pain.        Olam Dalton, MSN, [facility], [facility] [employer]

## 2024-06-24 ENCOUNTER — Other Ambulatory Visit: Payer: Self-pay

## 2024-06-24 ENCOUNTER — Encounter (HOSPITAL_BASED_OUTPATIENT_CLINIC_OR_DEPARTMENT_OTHER): Payer: Self-pay

## 2024-06-24 ENCOUNTER — Emergency Department (HOSPITAL_BASED_OUTPATIENT_CLINIC_OR_DEPARTMENT_OTHER)
Admission: EM | Admit: 2024-06-24 | Discharge: 2024-06-24 | Disposition: A | Discharge status: Home / Self Care | Attending: Emergency Medicine | Admitting: Emergency Medicine

## 2024-06-24 DIAGNOSIS — L509 Urticaria, unspecified: Secondary | ICD-10-CM | POA: Insufficient documentation

## 2024-06-24 DIAGNOSIS — R21 Rash and other nonspecific skin eruption: Secondary | ICD-10-CM | POA: Diagnosis present

## 2024-06-24 MED ORDER — HYDROXYZINE HCL 25 MG PO TABS: 25.0000 mg | ORAL_TABLET | Freq: Four times a day (QID) | ORAL | 0 refills | Status: AC

## 2024-06-24 MED ORDER — PREDNISONE 10 MG PO TABS
20.0000 mg | ORAL_TABLET | Freq: Two times a day (BID) | ORAL | 0 refills | Status: AC
Start: 2024-06-24 — End: ?

## 2024-06-24 MED ORDER — FAMOTIDINE IN NACL 20-0.9 MG/50ML-% IV SOLN
20.0000 mg | Freq: Once | INTRAVENOUS | Status: AC
  Administered 2024-06-24: 20 mg via INTRAVENOUS
  Filled 2024-06-24: qty 50

## 2024-06-24 MED ORDER — DIPHENHYDRAMINE HCL 50 MG/ML IJ SOLN
25.0000 mg | Freq: Once | INTRAMUSCULAR | Status: AC
  Administered 2024-06-24: 25 mg via INTRAVENOUS
  Filled 2024-06-24: qty 1

## 2024-06-24 MED ORDER — METHYLPREDNISOLONE SODIUM SUCC 125 MG IJ SOLR
125.0000 mg | Freq: Once | INTRAMUSCULAR | Status: AC
  Administered 2024-06-24: 125 mg via INTRAVENOUS
  Filled 2024-06-24: qty 2

## 2024-06-24 NOTE — ED Triage Notes (Signed)
 Missed abortion 8/16. Took last dose of Cytotec  yesterday. Has been itching since yesterday. States hives come and go. Denies difficulty breathing, throat soreness/ swelling. No facial swelling. Took benadryl  and loratadine  1 hour ago.

## 2024-06-24 NOTE — Discharge Instructions (Addendum)
 Begin taking prednisone  and hydroxyzine  as prescribed.  Return to the emergency department if symptoms significantly worsen or change.

## 2024-06-24 NOTE — ED Provider Notes (Signed)
 [place] EMERGENCY DEPARTMENT AT [facility] Provider Note   CSN: [id number] Arrival date & time: 06/24/24  0244     Patient presents with: Allergic Reaction   Christy [name] is a 38 y.o. female.   Patient is a 38 year old female with past medical history of anemia.  Patient presenting today with complaints of rash and itching.  She was recently diagnosed with a missed abortion by her OB/GYN and was prescribed Cytotec  to induce passage of products of conception.  She took this medication earlier today, then this evening began itching all over.  She noticed small hives to her arms, legs, and torso.  She denies any throat swelling or difficulty breathing.  No wheezing.       Prior to Admission medications   Medication Sig Start Date End Date Taking? Authorizing Provider  acetaminophen  (TYLENOL ) 500 MG tablet Take 500 mg by mouth every 6 (six) hours as needed. 03/25/23   [provider]  albuterol  (VENTOLIN  HFA) 108 (90 Base) MCG/ACT inhaler Inhale 1-2 puffs into the lungs every 6 (six) hours as needed for wheezing or shortness of breath. 10/03/22   Hazen Darryle BRAVO, FNP  cyclobenzaprine  (FLEXERIL ) 10 MG tablet Take 10 mg by mouth as needed for muscle spasms. 03/25/23   [provider]  diphenhydrAMINE  (BENADRYL ) 25 MG tablet Take 1 tablet (25 mg total) by mouth every 6 (six) hours as needed for itching. 04/25/24   Cardama, Raynell Moder, MD  EPINEPHrine  0.3 mg/0.3 mL IJ SOAJ injection Inject 0.3 mg into the muscle as needed for anaphylaxis. 05/30/24   Mesner, Selinda, MD  famotidine  (PEPCID ) 20 MG tablet Take 2 tablets (40 mg total) by mouth daily for 5 days. 05/29/24 06/03/24  Kammerer, Megan L, DO  Ferric Maltol  30 MG CAPS Take 1 capsule (30 mg total) by mouth 2 (two) times daily. Please take one hour before breakfast and dinner 06/21/24   Littie Olam LABOR, NP  ibuprofen  (ADVIL ) 400 MG tablet Take 400 mg by mouth as needed for moderate pain (pain score 4-6).  03/25/23   [provider]  loratadine  (CLARITIN ) 10 MG tablet Take 1 tablet (10 mg total) by mouth daily for 5 days. 04/11/24 04/16/24  Amin, Ankit C, MD  misoprostol  (CYTOTEC ) 200 MCG tablet Take 4 tablets (800 mcg total) by mouth once for 1 dose. Place all 4 tabs on the side of your  cheek 06/21/24 06/21/24  Littie Olam LABOR, NP  ondansetron  (ZOFRAN ) 4 MG tablet Take 2 tablets (8 mg total) by mouth every 8 (eight) hours as needed for up to 3 days for nausea or vomiting. 06/21/24 06/24/24  Littie Olam LABOR, NP  oxycodone  (OXY-IR) 5 MG capsule Take 2 capsules (10 mg total) by mouth every 6 (six) hours as needed for up to 3 days for pain. 06/21/24 06/24/24  Littie Olam LABOR, NP  furosemide  (LASIX ) 20 MG tablet Take 1 tablet (20 mg total) by mouth daily. Patient not taking: Reported on 09/05/2018 07/30/18 03/20/21  Raford Lenis, MD    Allergies: Soybean oil, Amoxicillin, Penicillin v potassium, and Penicillins    Review of Systems  All other systems reviewed and are negative.   Updated Vital Signs BP 130/68   Pulse 73   Temp 98.3 F (36.8 C) (Oral)   Resp 18   LMP 05/21/2024 (Approximate)   SpO2 100%   Breastfeeding Unknown   Physical Exam Vitals and nursing note reviewed.  Constitutional:      General: She is not in  acute distress.    Appearance: She is well-developed. She is not diaphoretic.  HENT:     Head: Normocephalic and atraumatic.  Cardiovascular:     Rate and Rhythm: Normal rate and regular rhythm.     Heart sounds: No murmur heard.    No friction rub. No gallop.  Pulmonary:     Effort: Pulmonary effort is normal. No respiratory distress.     Breath sounds: Normal breath sounds. No wheezing.  Abdominal:     General: Bowel sounds are normal. There is no distension.     Palpations: Abdomen is soft.     Tenderness: There is no abdominal tenderness.  Musculoskeletal:        General: Normal range of motion.     Cervical back: Normal range of motion and neck supple.   Skin:    General: Skin is warm and dry.     Comments: There are small urticarial lesions noted to the arms, legs and torso.  Neurological:     General: No focal deficit present.     Mental Status: She is alert and oriented to person, place, and time.     (all labs ordered are listed, but only abnormal results are displayed) Labs Reviewed - No data to display  EKG: None  Radiology: No results found.   Procedures   Medications Ordered in the ED  diphenhydrAMINE  (BENADRYL ) injection 25 mg (has no administration in time range)  famotidine  (PEPCID ) IVPB 20 mg premix (has no administration in time range)  methylPREDNISolone  sodium succinate (SOLU-MEDROL ) 125 mg/2 mL injection 125 mg (has no administration in time range)                                    Medical Decision Making Risk Prescription drug management.   Patient is a 38 year old female presenting with rash and itching as described in the HPI.  This started after taking mifepristone to induce a missed abortion.  Patient arrives here with stable vital signs and is afebrile.  Physical examination reveals an urticarial rash, but no apparent airway involvement.  IV access was established and patient given Solu-Medrol  along with Benadryl  and Pepcid .  Her rash is improving and I feel as though patient can safely be discharged.  She will be discharged with prednisone  and hydroxyzine .     Final diagnoses:  None    ED Discharge Orders     None          Geroldine Berg, MD 06/24/24 [id number]

## 2024-07-08 ENCOUNTER — Emergency Department (HOSPITAL_BASED_OUTPATIENT_CLINIC_OR_DEPARTMENT_OTHER)
Admission: EM | Admit: 2024-07-08 | Discharge: 2024-07-08 | Disposition: A | Discharge status: Home / Self Care | Attending: Emergency Medicine | Admitting: Emergency Medicine

## 2024-07-08 ENCOUNTER — Other Ambulatory Visit: Payer: Self-pay

## 2024-07-08 ENCOUNTER — Encounter (HOSPITAL_BASED_OUTPATIENT_CLINIC_OR_DEPARTMENT_OTHER): Payer: Self-pay

## 2024-07-08 DIAGNOSIS — T7840XA Allergy, unspecified, initial encounter: Secondary | ICD-10-CM | POA: Insufficient documentation

## 2024-07-08 DIAGNOSIS — R21 Rash and other nonspecific skin eruption: Secondary | ICD-10-CM | POA: Diagnosis present

## 2024-07-08 MED ORDER — METHYLPREDNISOLONE SODIUM SUCC 125 MG IJ SOLR
60.0000 mg | Freq: Once | INTRAMUSCULAR | Status: AC
  Administered 2024-07-08: 60 mg via INTRAMUSCULAR
  Filled 2024-07-08: qty 2

## 2024-07-08 MED ORDER — DEXAMETHASONE 4 MG PO TABS: 4.0000 mg | ORAL_TABLET | Freq: Once | ORAL | 0 refills | Status: AC

## 2024-07-08 MED ORDER — FAMOTIDINE 20 MG PO TABS
20.0000 mg | ORAL_TABLET | Freq: Once | ORAL | Status: AC
  Administered 2024-07-08: 20 mg via ORAL
  Filled 2024-07-08: qty 1

## 2024-07-08 NOTE — ED Provider Notes (Signed)
 Christy [name] Provider Note   CSN: [id number] Arrival date & time: 07/08/24  0025     History Chief Complaint  Patient presents with   Rash   Urticaria    HPI Christy [name] is a 38 y.o. female presenting for chief complaint of rash. Ate out.  Has hx of soy allergy and states that has presented similarly. 100mg  of benadryll an hour prior to arrival   Patient's recorded medical, surgical, social, medication list and allergies were reviewed in the Snapshot window as part of the initial history.   Review of Systems   Review of Systems  Constitutional:  Negative for chills and fever.  HENT:  Negative for ear pain and sore throat.   Eyes:  Negative for pain and visual disturbance.  Respiratory:  Negative for cough and shortness of breath.   Cardiovascular:  Negative for chest pain and palpitations.  Gastrointestinal:  Negative for abdominal pain and vomiting.  Genitourinary:  Negative for dysuria and hematuria.  Musculoskeletal:  Negative for arthralgias and back pain.  Skin:  Positive for rash. Negative for color change.  Neurological:  Negative for seizures and syncope.  All other systems reviewed and are negative.   Physical Exam Updated Vital Signs BP 128/84   Pulse 69   Temp 98.6 F (37 C)   Resp 18   LMP 05/21/2024 (Approximate)   SpO2 99%  Physical Exam Vitals and nursing note reviewed.  Constitutional:      General: She is not in acute distress.    Appearance: She is well-developed.  HENT:     Head: Normocephalic and atraumatic.  Eyes:     Conjunctiva/sclera: Conjunctivae normal.  Cardiovascular:     Rate and Rhythm: Normal rate and regular rhythm.     Heart sounds: No murmur heard. Pulmonary:     Effort: Pulmonary effort is normal. No respiratory distress.     Breath sounds: Normal breath sounds.  Abdominal:     General: There is no distension.     Palpations: Abdomen is soft.     Tenderness: There  is no abdominal tenderness. There is no right CVA tenderness or left CVA tenderness.  Musculoskeletal:        General: No swelling or tenderness. Normal range of motion.     Cervical back: Neck supple.  Skin:    General: Skin is warm and dry.     Findings: Rash present.  Neurological:     General: No focal deficit present.     Mental Status: She is alert and oriented to person, place, and time. Mental status is at baseline.     Cranial Nerves: No cranial nerve deficit.      ED Course/ Medical Decision Making/ A&P    Procedures Procedures   Medications Ordered in ED Medications  famotidine  (PEPCID ) tablet 20 mg (20 mg Oral Given 07/08/24 0104)  methylPREDNISolone  sodium succinate (SOLU-MEDROL ) 125 mg/2 mL injection 60 mg (60 mg Intramuscular Given 07/08/24 0104)   Medical Decision Making:   Christy [name] is a 38 y.o. female who presented to the ED today with rash,hives  detailed above.    Additional history discussed with patient's family/caregivers.  Complete initial physical exam performed, notably the patient  was HDS inNAD.    Reviewed and confirmed nursing documentation for past medical history, family history, social history.    Initial Assessment/plan:   Patient's constellation of symptoms above are most consistent with an allergic reaction. Considered anaphylactic reaction  less likely given only single system of involvement and localized symptoms.  This is most consistent with an acute complicated illness  Initial Plan:  Symptomatic management with histamine blockade including diphenhydramine , famotidine  and steroid.  Patient will be observed for progression of disease despite administration of medication. Objective evaluation as below reviewed    Reassessment and Plan:   After observation window, patient is grossly improved. Discussed ongoing supportive care and management with medications in the outpatient setting and patient feels comfortable with  discharge.    Clinical Impression:  1. Allergic reaction, initial encounter      Discharge   Final Clinical Impression(s) / ED Diagnoses Final diagnoses:  Allergic reaction, initial encounter    Rx / DC Orders ED Discharge Orders          Ordered    dexamethasone  (DECADRON ) 4 MG tablet   Once        07/08/24 0158              Rio Kidane, MD 07/08/24 [phone]

## 2024-07-08 NOTE — ED Triage Notes (Addendum)
 Patient reports rash starting 4 hours ago. Has a soy allergy. She reports eating grilled food recently but did not think it had anything that had soy in its. Also reports taking new supplement. Has the label but doesn't say the actual supplement just ingredients. Airway in tact, eyes slightly puffy, slight rash noted. Benadryl  taken at home. Has EpiPen  at home

## 2024-09-06 ENCOUNTER — Encounter (HOSPITAL_BASED_OUTPATIENT_CLINIC_OR_DEPARTMENT_OTHER): Payer: Self-pay | Admitting: Emergency Medicine

## 2024-09-06 ENCOUNTER — Emergency Department (HOSPITAL_BASED_OUTPATIENT_CLINIC_OR_DEPARTMENT_OTHER)
Admission: EM | Admit: 2024-09-06 | Discharge: 2024-09-07 | Disposition: A | Discharge status: Home / Self Care | Attending: Emergency Medicine | Admitting: Emergency Medicine

## 2024-09-06 DIAGNOSIS — T783XXA Angioneurotic edema, initial encounter: Secondary | ICD-10-CM

## 2024-09-06 DIAGNOSIS — T7840XA Allergy, unspecified, initial encounter: Secondary | ICD-10-CM | POA: Insufficient documentation

## 2024-09-06 LAB — BASIC METABOLIC PANEL WITH GFR
Anion gap: 10 (ref 5–15)
BUN: 9 mg/dL (ref 6–20)
CO2: 21 mmol/L — ABNORMAL LOW (ref 22–32)
Calcium: 9.2 mg/dL (ref 8.9–10.3)
Chloride: 104 mmol/L (ref 98–111)
Creatinine, Ser: 0.75 mg/dL (ref 0.44–1.00)
GFR, Estimated: >60 mL/min (ref >60)
Glucose, Bld: 85 mg/dL (ref 70–99)
Potassium: 4.2 mmol/L (ref 3.5–5.1)
Sodium: 136 mmol/L (ref 135–145)

## 2024-09-06 LAB — CBC WITH DIFFERENTIAL/PLATELET
Abs Immature Granulocytes: 0.01 K/uL (ref 0.00–0.07)
Basophils Absolute: 0 K/uL (ref 0.0–0.1)
Basophils Relative: 0 %
Eosinophils Absolute: 0.2 K/uL (ref 0.0–0.5)
Eosinophils Relative: 2 %
HCT: 31 % — ABNORMAL LOW (ref 36.0–46.0)
Hemoglobin: 10.1 g/dL — ABNORMAL LOW (ref 12.0–15.0)
Immature Granulocytes: 0 %
Lymphocytes Relative: 25 %
Lymphs Abs: 2.1 K/uL (ref 0.7–4.0)
MCH: 24 pg — ABNORMAL LOW (ref 26.0–34.0)
MCHC: 32.6 g/dL (ref 30.0–36.0)
MCV: 73.6 fL — ABNORMAL LOW (ref 80.0–100.0)
Monocytes Absolute: 0.8 K/uL (ref 0.1–1.0)
Monocytes Relative: 10 %
Neutro Abs: 5.2 K/uL (ref 1.7–7.7)
Neutrophils Relative %: 63 %
Platelets: 371 K/uL (ref 150–400)
RBC: 4.21 MIL/uL (ref 3.87–5.11)
RDW: 15.9 % — ABNORMAL HIGH (ref 11.5–15.5)
WBC: 8.3 K/uL (ref 4.0–10.5)
nRBC: 0 % (ref 0.0–0.2)

## 2024-09-06 LAB — PREGNANCY, URINE: Preg Test, Ur: NEGATIVE

## 2024-09-06 MED ORDER — SODIUM CHLORIDE 0.9 % IV BOLUS
1000.0000 mL | Freq: Once | INTRAVENOUS | Status: AC
  Administered 2024-09-06: 1000 mL via INTRAVENOUS

## 2024-09-06 MED ORDER — EPINEPHRINE 0.3 MG/0.3ML IJ SOAJ
0.3000 mg | Freq: Once | INTRAMUSCULAR | Status: AC
  Administered 2024-09-06: 0.3 mg via INTRAMUSCULAR
  Filled 2024-09-06: qty 0.3

## 2024-09-06 MED ORDER — FAMOTIDINE IN NACL 20-0.9 MG/50ML-% IV SOLN
20.0000 mg | Freq: Once | INTRAVENOUS | Status: AC
  Administered 2024-09-06: 20 mg via INTRAVENOUS
  Filled 2024-09-06: qty 50

## 2024-09-06 MED ORDER — METHYLPREDNISOLONE SODIUM SUCC 125 MG IJ SOLR
125.0000 mg | Freq: Once | INTRAMUSCULAR | Status: AC
  Administered 2024-09-06: 125 mg via INTRAVENOUS
  Filled 2024-09-06: qty 2

## 2024-09-06 MED ORDER — SODIUM CHLORIDE 0.9 % IV SOLN: INTRAVENOUS | Status: DC

## 2024-09-06 NOTE — ED Provider Notes (Addendum)
 [place] EMERGENCY DEPARTMENT AT [facility] Provider Note   CSN: [id number] Arrival date & time: 09/06/24  2103     Patient presents with: Allergic Reaction   Christy [name] is a 38 y.o. female.    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  The patient has presented to the emergency department 10 times in the last year for angioedema related concerns, intermittently has had to be admitted to the hospital for the same.  She states that after eating [employer] she had an episode of nausea and vomiting.  She then noticed lip swelling to her upper lip concerning for recurrent angioedema.  No known allergies but she has not been tested.  She denies any swelling in her throat or difficulty breathing or phonating.  Her nausea and vomiting has since stopped.  She denies any urticaria but endorses diffuse skin itching.  She took 100 mg of Benadryl  prior to arrival.  She does feel like the lip swelling is worsening.  Prior to Admission medications   Medication Sig Start Date End Date Taking? Authorizing Provider  acetaminophen  (TYLENOL ) 500 MG tablet Take 500 mg by mouth every 6 (six) hours as needed. 03/25/23   [provider]  albuterol  (VENTOLIN  HFA) 108 (90 Base) MCG/ACT inhaler Inhale 1-2 puffs into the lungs every 6 (six) hours as needed for wheezing or shortness of breath. 10/03/22   Hazen Darryle BRAVO, FNP  cyclobenzaprine  (FLEXERIL ) 10 MG tablet Take 10 mg by mouth as needed for muscle spasms. 03/25/23   [provider]  diphenhydrAMINE  (BENADRYL ) 25 MG tablet Take 1 tablet (25 mg total) by mouth every 6 (six) hours as needed for itching. 04/25/24   Cardama, Raynell Moder, MD  EPINEPHrine  0.3 mg/0.3 mL IJ SOAJ injection Inject 0.3 mg into the muscle as needed for anaphylaxis. 05/30/24   Mesner, Selinda, MD   famotidine  (PEPCID ) 20 MG tablet Take 2 tablets (40 mg total) by mouth daily for 5 days. 05/29/24 06/03/24  Kammerer, Megan L, DO  Ferric Maltol  30 MG CAPS Take 1 capsule (30 mg total) by mouth 2 (two) times daily. Please take one hour before breakfast and dinner 06/21/24   Littie Olam LABOR, NP  hydrOXYzine  (ATARAX ) 25 MG tablet Take 1 tablet (25 mg total) by mouth every 6 (six) hours. 06/24/24   Geroldine Berg, MD  ibuprofen  (ADVIL ) 400 MG tablet Take 400 mg by mouth as needed for moderate pain (pain score 4-6). 03/25/23   [provider]  loratadine  (CLARITIN ) 10 MG tablet Take 1 tablet (10 mg total) by mouth daily for 5 days. 04/11/24 04/16/24  Amin, Ankit C, MD  misoprostol  (CYTOTEC ) 200 MCG tablet Take 4 tablets (800 mcg total) by mouth once for 1 dose. Place all 4 tabs on the side of your  cheek 06/21/24 06/21/24  Littie Olam LABOR, NP  predniSONE  (DELTASONE ) 10 MG tablet Take 2 tablets (20 mg total) by mouth 2 (two) times daily with a meal. 06/24/24   Geroldine Berg, MD  furosemide  (LASIX ) 20 MG tablet Take 1 tablet (20 mg total) by mouth daily. Patient not taking: Reported on 09/05/2018 07/30/18 03/20/21  Raford Lenis, MD    Allergies: Soybean oil, Amoxicillin, Penicillin v potassium, and Penicillins    Review of Systems  All other systems reviewed and are negative.   Updated Vital Signs BP (!) 151/75 (BP  Location: Left Arm)   Pulse 89   Temp 97.9 F (36.6 C) (Temporal)   Resp 20   LMP 08/20/2024 (Approximate)   SpO2 100%   Physical Exam Vitals and nursing note reviewed.  Constitutional:      General: She is not in acute distress. HENT:     Head: Normocephalic and atraumatic.     Mouth/Throat:     Comments: Angioedema noted to the upper lip on the left, no trismus, no intraoral swelling Eyes:     Conjunctiva/sclera: Conjunctivae normal.     Pupils: Pupils are equal, round, and reactive to light.  Cardiovascular:     Rate and Rhythm: Normal rate and regular rhythm.  Pulmonary:      Effort: Pulmonary effort is normal. No respiratory distress.     Breath sounds: Normal breath sounds. No wheezing.     Comments: No stridor Abdominal:     General: There is no distension.     Tenderness: There is no abdominal tenderness. There is no guarding.  Musculoskeletal:        General: No deformity or signs of injury.     Cervical back: Neck supple.  Skin:    Findings: No lesion or rash.  Neurological:     General: No focal deficit present.     Mental Status: She is alert. Mental status is at baseline.     (all labs ordered are listed, but only abnormal results are displayed) Labs Reviewed  CBC WITH DIFFERENTIAL/PLATELET - Abnormal; Notable for the following components:      Result Value   Hemoglobin 10.1 (*)    HCT 31.0 (*)    MCV 73.6 (*)    MCH 24.0 (*)    RDW 15.9 (*)    All other components within normal limits  BASIC METABOLIC PANEL WITH GFR - Abnormal; Notable for the following components:   CO2 21 (*)    All other components within normal limits  PREGNANCY, URINE    EKG: None  Radiology: No results found.   .Critical Care  Performed by: Jerrol Agent, MD Authorized by: Jerrol Agent, MD   Critical care provider statement:    Critical care time (minutes):  30   Critical care was time spent personally by me on the following activities:  Development of treatment plan with patient or surrogate, discussions with consultants, evaluation of patient's response to treatment, examination of patient, ordering and review of laboratory studies, ordering and review of radiographic studies, ordering and performing treatments and interventions, pulse oximetry, re-evaluation of patient's condition and review of old charts    Medications Ordered in the ED  sodium chloride  0.9 % bolus 1,000 mL (0 mLs Intravenous Stopped 09/06/24 2244)    And  0.9 %  sodium chloride  infusion ( Intravenous New Bag/Given 09/06/24 2245)  EPINEPHrine  (EPI-PEN) injection 0.3 mg (0.3 mg  Intramuscular Given 09/06/24 2131)  methylPREDNISolone  sodium succinate (SOLU-MEDROL ) 125 mg/2 mL injection 125 mg (125 mg Intravenous Given 09/06/24 2132)  famotidine  (PEPCID ) IVPB 20 mg premix (0 mg Intravenous Stopped 09/06/24 2213)                                    Medical Decision Making Amount and/or Complexity of Data Reviewed Labs: ordered.  Risk Prescription drug management.     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  The patient has presented to the emergency department 10 times in the last year for angioedema related concerns, intermittently has had to be admitted to the hospital for the same.  She states that after eating [employer] she had an episode of nausea and vomiting.  She then noticed lip swelling to her upper lip concerning for recurrent angioedema.  No known allergies but she has not been tested.  She denies any swelling in her throat or difficulty breathing or phonating.  Her nausea and vomiting has since stopped.  She denies any urticaria but endorses diffuse skin itching.  She took 100 mg of Benadryl  prior to arrival.  She does feel like the lip swelling is worsening.  On arrival, the patient was vitally stable, presenting with evidence of angioedema.  Given the nausea and vomiting earlier, will administer epinephrine .  Solu-Medrol  and Pepcid  was also ordered, patient has already taken 100 mg of oral Benadryl .  Patient informed of the need for observation for 4 hours post epinephrine  administration. PT reassessed and feels that the swelling is improved. Signout given to Dr. Trine at 2300, patient stable after 3 hours of observation during my shift.     Final diagnoses:  None    ED Discharge Orders     None          Jerrol Agent, MD 09/06/24 2308    Jerrol Agent, MD 09/06/24 2357

## 2024-09-06 NOTE — ED Triage Notes (Signed)
 Swelling lips Itching around the mouth Denies tongue swelling, no breathing issues Started around 8pm Took 100mg  benadryl  PTA Unknown allergin

## 2024-10-17 ENCOUNTER — Emergency Department (HOSPITAL_COMMUNITY)
Admission: EM | Admit: 2024-10-17 | Discharge: 2024-10-17 | Discharge status: Against Medical Advice | Attending: Emergency Medicine | Admitting: Emergency Medicine

## 2024-10-17 ENCOUNTER — Other Ambulatory Visit: Payer: Self-pay

## 2024-10-17 DIAGNOSIS — M79601 Pain in right arm: Secondary | ICD-10-CM | POA: Insufficient documentation

## 2024-10-17 DIAGNOSIS — Z5321 Procedure and treatment not carried out due to patient leaving prior to being seen by health care provider: Secondary | ICD-10-CM | POA: Insufficient documentation

## 2024-10-17 NOTE — ED Notes (Signed)
 Patient decided to leave, and stated she will come back another day due to long wait.

## 2024-10-17 NOTE — ED Triage Notes (Addendum)
 Pt states she woke up with some rash, hives and itchiness all over, she had some almond milk piror to go to bed. Pt is able to talk on complete sentences, no signs of throat swollen, no respiratory distress at this time. Pt reports taking Alegra pill pta.

## 2024-11-05 ENCOUNTER — Encounter (HOSPITAL_BASED_OUTPATIENT_CLINIC_OR_DEPARTMENT_OTHER): Payer: Self-pay

## 2024-11-05 ENCOUNTER — Other Ambulatory Visit: Payer: Self-pay

## 2024-11-05 ENCOUNTER — Emergency Department (HOSPITAL_BASED_OUTPATIENT_CLINIC_OR_DEPARTMENT_OTHER)
Admission: EM | Admit: 2024-11-05 | Discharge: 2024-11-05 | Disposition: A | Discharge status: Home / Self Care | Attending: Emergency Medicine | Admitting: Emergency Medicine

## 2024-11-05 DIAGNOSIS — T783XXA Angioneurotic edema, initial encounter: Secondary | ICD-10-CM | POA: Insufficient documentation

## 2024-11-05 DIAGNOSIS — T7840XA Allergy, unspecified, initial encounter: Secondary | ICD-10-CM | POA: Diagnosis present

## 2024-11-05 MED ORDER — EPINEPHRINE 0.3 MG/0.3ML IJ SOAJ: 0.3000 mg | INTRAMUSCULAR | 0 refills | Status: AC | PRN

## 2024-11-05 MED ORDER — METHYLPREDNISOLONE SODIUM SUCC 125 MG IJ SOLR
125.0000 mg | Freq: Once | INTRAMUSCULAR | Status: AC
  Administered 2024-11-05: 125 mg via INTRAVENOUS
  Filled 2024-11-05: qty 2

## 2024-11-05 MED ORDER — EPINEPHRINE 0.3 MG/0.3ML IJ SOAJ
0.3000 mg | Freq: Once | INTRAMUSCULAR | Status: AC
  Administered 2024-11-05: 0.3 mg via INTRAMUSCULAR
  Filled 2024-11-05: qty 0.3

## 2024-11-05 NOTE — ED Triage Notes (Signed)
 Pt presents via POV c/o allergic reaction for the last few hours. Reports took PO Pepcid  and Benadryl  at home at x1hr PTA. Denies SOB or tongue swelling. Airway intact. Reports lip swelling and rash.

## 2024-11-05 NOTE — ED Provider Notes (Signed)
 " [place] EMERGENCY DEPARTMENT AT [facility] Provider Note   CSN: [id number] Arrival date & time: 11/05/24  9987     Patient presents with: Allergic Reaction   Christy [name] is a 38 y.o. female.    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  The patient states that she had a bun at [facility] that contained soy and subsequently developed lip swelling.  She has presented to the emergency department 12 times over the past year for angioedema related concerns.  Intermittently has had to be admitted to the hospital for the same.  She took Benadryl  and Pepcid  at home 1 hour prior to arrival.  She denies any shortness of breath, nausea.  She denies any tongue swelling.  She endorses lip swelling on the right and facial swelling with associated hives.  She denies a sensation that her throat is closing up but does feel like the swelling is worsening.  Prior to Admission medications  Medication Sig Start Date End Date Taking? Authorizing Provider  EPINEPHrine  0.3 mg/0.3 mL IJ SOAJ injection Inject 0.3 mg into the muscle as needed for anaphylaxis. 11/05/24  Yes Jerrol Agent, MD  acetaminophen  (TYLENOL ) 500 MG tablet Take 500 mg by mouth every 6 (six) hours as needed. 03/25/23   [provider]  albuterol  (VENTOLIN  HFA) 108 (90 Base) MCG/ACT inhaler Inhale 1-2 puffs into the lungs every 6 (six) hours as needed for wheezing or shortness of breath. 10/03/22   Hazen Darryle BRAVO, FNP  cyclobenzaprine  (FLEXERIL ) 10 MG tablet Take 10 mg by mouth as needed for muscle spasms. 03/25/23   [provider]  diphenhydrAMINE  (BENADRYL ) 25 MG tablet Take 1 tablet (25 mg total) by mouth every 6 (six) hours as needed for itching. 04/25/24   Cardama, Raynell Moder, MD  famotidine  (PEPCID ) 20  MG tablet Take 2 tablets (40 mg total) by mouth daily for 5 days. 05/29/24 06/03/24  Kammerer, Megan L, DO  Ferric Maltol  30 MG CAPS Take 1 capsule (30 mg total) by mouth 2 (two) times daily. Please take one hour before breakfast and dinner 06/21/24   Littie Olam LABOR, NP  hydrOXYzine  (ATARAX ) 25 MG tablet Take 1 tablet (25 mg total) by mouth every 6 (six) hours. 06/24/24   Geroldine Berg, MD  ibuprofen  (ADVIL ) 400 MG tablet Take 400 mg by mouth as needed for moderate pain (pain score 4-6). 03/25/23   [provider]  loratadine  (CLARITIN ) 10 MG tablet Take 1 tablet (10 mg total) by mouth daily for 5 days. 04/11/24 04/16/24  Amin, Ankit C, MD  misoprostol  (CYTOTEC ) 200 MCG tablet Take 4 tablets (800 mcg total) by mouth once for 1 dose. Place all 4 tabs on the side of your  cheek 06/21/24 06/21/24  Littie Olam LABOR, NP  predniSONE  (DELTASONE ) 10 MG tablet Take 2 tablets (20 mg total) by mouth 2 (two) times daily with a meal. 06/24/24   Geroldine Berg, MD  furosemide  (LASIX ) 20 MG tablet Take 1 tablet (20 mg total) by mouth daily. Patient not taking: Reported on 09/05/2018 07/30/18 03/20/21  Raford Lenis, MD    Allergies: Soybean oil, Amoxicillin, Penicillin v potassium, and Penicillins    Review of Systems  All other systems reviewed and are negative.   Updated Vital Signs BP 96/65   Pulse 72   Temp 98.7 F (  37.1 C) (Oral)   Resp 17   LMP 05/21/2024 (Approximate)   SpO2 97%   Breastfeeding No   Physical Exam Vitals and nursing note reviewed.  Constitutional:      General: She is not in acute distress.    Appearance: She is well-developed.  HENT:     Head: Normocephalic and atraumatic.     Comments: Angioedema of the right upper lip present, no trismus, no tongue swelling, no stridor Eyes:     Conjunctiva/sclera: Conjunctivae normal.  Cardiovascular:     Rate and Rhythm: Normal rate and regular rhythm.     Heart sounds: No murmur heard. Pulmonary:     Effort: Pulmonary effort is  normal. No respiratory distress.     Breath sounds: Normal breath sounds.     Comments: Lungs clear to auscultation Abdominal:     Palpations: Abdomen is soft.     Tenderness: There is no abdominal tenderness.  Musculoskeletal:        General: No swelling.     Cervical back: Neck supple.  Skin:    General: Skin is warm and dry.     Capillary Refill: Capillary refill takes less than 2 seconds.     Comments: Scattered hives  Neurological:     Mental Status: She is alert.  Psychiatric:        Mood and Affect: Mood normal.     (all labs ordered are listed, but only abnormal results are displayed) Labs Reviewed - No data to display  EKG: None  Radiology: No results found.   .Critical Care  Performed by: Jerrol Agent, MD Authorized by: Jerrol Agent, MD   Critical care provider statement:    Critical care time (minutes):  30   Critical care was necessary to treat or prevent imminent or life-threatening deterioration of the following conditions:  Endocrine crisis   Critical care was time spent personally by me on the following activities:  Development of treatment plan with patient or surrogate, discussions with consultants, evaluation of patient's response to treatment, examination of patient, ordering and review of laboratory studies, ordering and review of radiographic studies, ordering and performing treatments and interventions, pulse oximetry, re-evaluation of patient's condition and review of old charts    Medications Ordered in the ED  EPINEPHrine  (EPI-PEN) injection 0.3 mg (0.3 mg Intramuscular Given 11/05/24 0035)  methylPREDNISolone  sodium succinate (SOLU-MEDROL ) 125 mg/2 mL injection 125 mg (125 mg Intravenous Given 11/05/24 0035)                                    Medical Decision Making Risk Prescription drug management.    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  The patient states that she had a bun at [facility] that contained soy and subsequently developed lip swelling.  She has presented to the emergency department 12 times over the past year for angioedema related concerns.  Intermittently has had to be admitted to the hospital for the same.  She took Benadryl  and Pepcid  at home 1 hour prior to arrival.  She denies any shortness of breath, nausea.  She denies any tongue swelling.  She endorses lip swelling on the right and facial swelling with associated hives.  She denies a sensation that her throat is closing up but does feel  like the swelling is worsening.  On arrival, the patient was vitally stable, afebrile, not tachycardic or tachypneic, BP 130/63, saturating her percent on room air.  On my exam the patient had angioedema present, no stridor, no concern for airway involvement.  Patient not meeting criteria for anaphylaxis however with worsening angioedema and known potential allergic mediator, will administer epinephrine .  Patient is status post Benadryl  and Pepcid , IV Solu-Medrol  administered.  IM epinephrine  was administered.  She was observed in the emergency department for 5-1/2 hours with subsequent improvement in symptoms.  She had no worsening.  She was protecting her airway.  I feel that the patient is stable for discharge at this time.  A prescription for an EpiPen  was represcribed as she did not have 1 at home.  She was advised close follow-up with her PCP and outpatient allergy, return precautions provided.       Final diagnoses:  Angioedema, initial encounter    ED Discharge Orders          Ordered    EPINEPHrine  0.3 mg/0.3 mL IJ SOAJ injection  As needed        11/05/24 0600               Jerrol Agent, MD 11/05/24 0600  "

## 2024-11-05 NOTE — Discharge Instructions (Addendum)
 You were treated for angioedema which is likely allergic mediated.  You were observed after administration of epinephrine  with improvement of symptoms.  Return for any worsening symptoms to the Emergency Department for repeat evaluation otherwise follow-up outpatient with an allergist.
